# Patient Record
Sex: Female | Born: 1955 | Race: Black or African American | Hispanic: No | Marital: Single | Smoking: Never smoker
Health system: Southern US, Community
[De-identification: ages and names within clinical notes are randomized; demographics above are authoritative.]

## PROBLEM LIST (undated history)

## (undated) DIAGNOSIS — I10 Essential (primary) hypertension: Secondary | ICD-10-CM

## (undated) DIAGNOSIS — O2230 Deep phlebothrombosis in pregnancy, unspecified trimester: Secondary | ICD-10-CM

## (undated) DIAGNOSIS — I82409 Acute embolism and thrombosis of unspecified deep veins of unspecified lower extremity: Secondary | ICD-10-CM

## (undated) DIAGNOSIS — N289 Disorder of kidney and ureter, unspecified: Secondary | ICD-10-CM

## (undated) HISTORY — PX: CARPAL TUNNEL RELEASE: SHX101

---

## 2017-03-19 ENCOUNTER — Encounter (HOSPITAL_COMMUNITY): Payer: Self-pay | Admitting: Emergency Medicine

## 2017-03-19 ENCOUNTER — Emergency Department (HOSPITAL_COMMUNITY): Payer: BLUE CROSS/BLUE SHIELD

## 2017-03-19 ENCOUNTER — Emergency Department (HOSPITAL_COMMUNITY)
Admission: EM | Admit: 2017-03-19 | Discharge: 2017-03-20 | Disposition: A | Discharge status: Home / Self Care | Payer: BLUE CROSS/BLUE SHIELD | Attending: Emergency Medicine | Admitting: Emergency Medicine

## 2017-03-19 ENCOUNTER — Ambulatory Visit (HOSPITAL_COMMUNITY)
Admission: EM | Admit: 2017-03-19 | Discharge: 2017-03-19 | Disposition: A | Discharge status: Nursing Home (Includes ICF) | Payer: 59 | Attending: Urgent Care | Admitting: Urgent Care

## 2017-03-19 DIAGNOSIS — R030 Elevated blood-pressure reading, without diagnosis of hypertension: Secondary | ICD-10-CM

## 2017-03-19 DIAGNOSIS — I161 Hypertensive emergency: Secondary | ICD-10-CM

## 2017-03-19 DIAGNOSIS — R1031 Right lower quadrant pain: Secondary | ICD-10-CM | POA: Insufficient documentation

## 2017-03-19 DIAGNOSIS — I10 Essential (primary) hypertension: Secondary | ICD-10-CM | POA: Insufficient documentation

## 2017-03-19 DIAGNOSIS — R109 Unspecified abdominal pain: Secondary | ICD-10-CM

## 2017-03-19 HISTORY — DX: Essential (primary) hypertension: I10

## 2017-03-19 LAB — CBC
HEMATOCRIT: 40.8 % (ref 36.0–46.0)
HEMOGLOBIN: 13.4 g/dL (ref 12.0–15.0)
MCH: 27.7 pg (ref 26.0–34.0)
MCHC: 32.8 g/dL (ref 30.0–36.0)
MCV: 84.3 fL (ref 78.0–100.0)
Platelets: 271 10*3/uL (ref 150–400)
RBC: 4.84 MIL/uL (ref 3.87–5.11)
RDW: 14.1 % (ref 11.5–15.5)
WBC: 13 10*3/uL — ABNORMAL HIGH (ref 4.0–10.5)

## 2017-03-19 LAB — BASIC METABOLIC PANEL
ANION GAP: 8 (ref 5–15)
BUN: 10 mg/dL (ref 6–20)
CO2: 23 mmol/L (ref 22–32)
Calcium: 9.5 mg/dL (ref 8.9–10.3)
Chloride: 108 mmol/L (ref 101–111)
Creatinine, Ser: 1.44 mg/dL — ABNORMAL HIGH (ref 0.44–1.00)
GFR calc Af Amer: 44 mL/min — ABNORMAL LOW (ref >60)
GFR calc non Af Amer: 38 mL/min — ABNORMAL LOW (ref >60)
GLUCOSE: 104 mg/dL — AB (ref 65–99)
POTASSIUM: 3.7 mmol/L (ref 3.5–5.1)
Sodium: 139 mmol/L (ref 135–145)

## 2017-03-19 LAB — POCT I-STAT, CHEM 8
BUN: 11 mg/dL (ref 6–20)
CREATININE: 1.6 mg/dL — AB (ref 0.44–1.00)
Calcium, Ion: 1.19 mmol/L (ref 1.15–1.40)
Chloride: 105 mmol/L (ref 101–111)
GLUCOSE: 101 mg/dL — AB (ref 65–99)
HEMATOCRIT: 45 % (ref 36.0–46.0)
Hemoglobin: 15.3 g/dL — ABNORMAL HIGH (ref 12.0–15.0)
POTASSIUM: 4 mmol/L (ref 3.5–5.1)
Sodium: 142 mmol/L (ref 135–145)
TCO2: 26 mmol/L (ref 22–32)

## 2017-03-19 LAB — I-STAT BETA HCG BLOOD, ED (MC, WL, AP ONLY): I-stat hCG, quantitative: 9.7 m[IU]/mL — ABNORMAL HIGH (ref <5)

## 2017-03-19 MED ORDER — LABETALOL HCL 200 MG PO TABS
300.0000 mg | ORAL_TABLET | Freq: Once | ORAL | Status: AC
  Administered 2017-03-19: 300 mg via ORAL
  Filled 2017-03-19: qty 2

## 2017-03-19 MED ORDER — MORPHINE SULFATE (PF) 4 MG/ML IV SOLN
4.0000 mg | Freq: Once | INTRAVENOUS | Status: AC
  Administered 2017-03-19: 4 mg via INTRAVENOUS
  Filled 2017-03-19: qty 1

## 2017-03-19 NOTE — Discharge Instructions (Signed)
Please report to the emergency room at [facility] immediately. Your ekg shows ST wave depression and T-wave inversion and you need to have an immediate work up to make sure you heart is okay.

## 2017-03-19 NOTE — ED Notes (Signed)
Pt ambulatory to the bathroom with stand-by assist. Gait steady

## 2017-03-19 NOTE — ED Notes (Signed)
Pt returned from ultrasound

## 2017-03-19 NOTE — ED Triage Notes (Signed)
Pt c/o epigastric pain that radiates to the back.Seen at [facility] and sent to ED for cardiac workup. Pain began last night, pain has been constant.

## 2017-03-19 NOTE — ED Triage Notes (Signed)
Pt. Stated, I feel like something is jabbing me on the right side constant since last night. Im also so cold.

## 2017-03-19 NOTE — ED Triage Notes (Signed)
Pt family member knocking on triage door requesting to know when her mother will be placed in a room. Informed her how long the pt has been here and how long our longest wait is. Also informed them that we will continue to monitor her vitals and blood work. Pt states she should be placed in a room d/t her BP. Informed her we will continue to monitor.

## 2017-03-19 NOTE — ED Provider Notes (Signed)
    MRN: [id number] DOB: July 12, 1955  Subjective:   Rhonda Mcneil is a 61 y.o. female presenting for chief complaint of Abdominal Pain (right side pain)  Reports 1 day history of persistent right sided abdominal pain. Denies fever, n/v, diarrhea, chest pain, shob, heart racing, palpitations, diaphoresis. Denies history of MI. She is supposed to be on BP medications. Does not have a PCP.  Rhonda Mcneil is taking an unknown BP medication. Also is allergic to bee venom.  Rhonda Mcneil  has a past medical history of Hypertension. Also  has a past surgical history that includes Carpal tunnel release.  Objective:   Vitals: BP (!) 228/116 (BP Location: Right Arm)   Pulse 71   Temp 98.7 F (37.1 C) (Oral)   Resp (!) 1   Ht 5\' 2"  (1.575 m)   Wt 147 lb (66.7 kg)   SpO2 100%   BMI 26.89 kg/m   Physical Exam  Constitutional: She is oriented to person, place, and time. She appears well-developed and well-nourished. She appears distressed.  HENT:  Mouth/Throat: Oropharynx is clear and moist.  Eyes: No scleral icterus.  Cardiovascular: Normal rate, regular rhythm and intact distal pulses.  Exam reveals no gallop and no friction rub.   No murmur heard. Pulmonary/Chest: No respiratory distress. She has no wheezes. She has no rales.  Abdominal: Soft. Bowel sounds are normal. She exhibits no distension and no mass. There is tenderness (right-sided/flank). There is no guarding.  Musculoskeletal: She exhibits no edema.  Neurological: She is alert and oriented to person, place, and time.  Skin: Skin is warm and dry. No rash noted. No erythema. No pallor.   ED ECG REPORT   Date: 03/19/2017  Rate: 65bpm  Rhythm: sinus arrhythmia  QRS Axis: normal  Intervals: normal  ST/T Wave abnormalities: ST depressions inferiorly  Conduction Disutrbances:none  Narrative Interpretation: ST depression in Leads III, aVF; T-wave flattening in lead II, V6; T-wave inversion in leads V3-V5. These findings suggest acute process.  Old  EKG Reviewed: none available  I have personally reviewed the EKG tracing and agree with the computerized printout as noted.   Results for orders placed or performed during the hospital encounter of 03/19/17 (from the past 24 hour(s))  I-STAT, chem 8     Status: Abnormal   Collection Time: 03/19/17  6:52 PM  Result Value Ref Range   Sodium 142 135 - 145 mmol/L   Potassium 4.0 3.5 - 5.1 mmol/L   Chloride 105 101 - 111 mmol/L   BUN 11 6 - 20 mg/dL   Creatinine, Ser 1.60 (H) 0.44 - 1.00 mg/dL   Glucose, Bld 101 (H) 65 - 99 mg/dL   Calcium, Ion 1.19 1.15 - 1.40 mmol/L   TCO2 26 22 - 32 mmol/L   Hemoglobin 15.3 (H) 12.0 - 15.0 g/dL   HCT 45.0 36.0 - 46.0 %   Assessment and Plan :   Right sided abdominal pain  Hypertensive emergency  Elevated blood pressure reading  Essential hypertension  Referred to ER immediately for work up of ACS. Discussed case with Dr. Joseph Art who is in agreement.  Jaynee Eagles, PA-C [facility]  03/19/2017  6:20 PM    Jaynee Eagles, PA-C 03/19/17 2020

## 2017-03-19 NOTE — ED Notes (Signed)
Patient transported to Ultrasound 

## 2017-03-19 NOTE — ED Provider Notes (Signed)
[facility] DEPT Provider Note   CSN: [id number] Arrival date & time: 03/19/17  1911     History   Chief Complaint Chief Complaint  Patient presents with  . Abdominal Pain  . Hypertension    HPI Rhonda Mcneil is a 61 y.o. female w PMHx HTN, Presenting to the ED for acute onset of right-sided back/flank pain that began suddenly yesterday evening. Patient states pain has been worsening since that time, and is sharp "stabbing" located at the base of the right ribs posteriorly. Pain is worse with movement. She denies fever/chills, abdominal pain, nausea/vomiting, urinary symptoms, hematuria, vaginal bleeding or discharge. Patient sent from urgent care for hypertension, patient reports she did not take her blood pressure medications today; which includes labetalol 300mg  BID, norvasc 10mg  QD, and lisinopril 40mg  QD. She denies headache or vision changes. No history of abdominal surgeries. No history of gallstones or kidney stones. 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  She reports she just moved to [place] from [place] and has not established primary care. Requesting refills of BP medication as she does not believe she has refills, though she is not out of her medication yet. The history is provided by the patient.    Past Medical History:  Diagnosis Date  . Hypertension     There are no active problems to display for this patient.   Past Surgical History:  Procedure Laterality Date  . CARPAL TUNNEL RELEASE      OB History    No data available       Home Medications    Prior to Admission medications   Medication Sig Start Date End Date Taking? Authorizing Provider  amLODipine (NORVASC) 10 MG tablet Take 1 tablet (10 mg total) by mouth daily. 03/20/17   Russo, [place] N, PA-C  cyclobenzaprine (FLEXERIL) 10 MG tablet Take 1 tablet (10 mg total) by mouth at bedtime as needed for muscle spasms. 03/20/17   Russo, [place] N, PA-C  labetalol (NORMODYNE) 300 MG tablet Take 1  tablet (300 mg total) by mouth 2 (two) times daily. 03/20/17   Russo, [place] N, PA-C  lisinopril (PRINIVIL,ZESTRIL) 40 MG tablet Take 1 tablet (40 mg total) by mouth daily. 03/20/17   Russo, [place] N, PA-C    Family History No family history on file.  Social History Social History  Substance Use Topics  . Smoking status: Never Smoker  . Smokeless tobacco: Never Used  . Alcohol use Yes     Allergies   Bee venom   Review of Systems Review of Systems  Constitutional: Positive for appetite change. Negative for chills and fever.  Respiratory: Negative for shortness of breath.   Cardiovascular: Negative for chest pain.  Gastrointestinal: Negative for abdominal pain, diarrhea, nausea and vomiting.       Gas  Genitourinary: Positive for flank pain. Negative for dysuria, frequency, hematuria, vaginal bleeding and vaginal discharge.  Musculoskeletal: Positive for back pain.  All other systems reviewed and are negative.    Physical Exam Updated Vital Signs BP (!) 217/83   Pulse 68   Temp 99.6 F (37.6 C) (Oral)   Resp 16   Ht 5\' 2"  (1.575 m)   Wt 66.7 kg (147 lb)   SpO2 96%   BMI 26.89 kg/m   Physical Exam  Constitutional: She appears well-developed and well-nourished.  Appears uncomfortable, however not ill-appearing.  HENT:  Head: Normocephalic and atraumatic.  Mouth/Throat: Oropharynx is clear and moist.  Eyes: Conjunctivae are normal.  Cardiovascular: Normal rate, regular  rhythm, normal heart sounds and intact distal pulses.   Pulmonary/Chest: Effort normal and breath sounds normal.  Abdominal: Soft. Normal appearance and bowel sounds are normal. She exhibits no distension. There is no tenderness. There is CVA tenderness (Minimal right-sided). There is no rigidity, no rebound, no guarding and negative Murphy's sign.  Neurological: She is alert.  Skin: Skin is warm.  Psychiatric: She has a normal mood and affect. Her behavior is normal.  Nursing note and vitals  reviewed.    ED Treatments / Results  Labs (all labs ordered are listed, but only abnormal results are displayed) Labs Reviewed  BASIC METABOLIC PANEL - Abnormal; Notable for the following:       Result Value   Glucose, Bld 104 (*)    Creatinine, Ser 1.44 (*)    GFR calc non Af Amer 38 (*)    GFR calc Af Amer 44 (*)    All other components within normal limits  CBC - Abnormal; Notable for the following:    WBC 13.0 (*)    All other components within normal limits  URINALYSIS, ROUTINE W REFLEX MICROSCOPIC - Abnormal; Notable for the following:    Color, Urine STRAW (*)    All other components within normal limits  I-STAT BETA HCG BLOOD, ED ([facility], [facility], [facility] ONLY) - Abnormal; Notable for the following:    I-stat hCG, quantitative 9.7 (*)    All other components within normal limits  I-STAT TROPONIN, ED    EKG  EKG Interpretation None       Radiology Dg Chest 2 View  Result Date: 03/19/2017 CLINICAL DATA:  Chest pain.  High blood pressure for 1 day. EXAM: CHEST  2 VIEW COMPARISON:  None. FINDINGS: The cardiomediastinal contours are normal. The lungs are clear. Pulmonary vasculature is normal. No consolidation, pleural effusion, or pneumothorax. No acute osseous abnormalities are seen. IMPRESSION: No acute pulmonary process. Electronically Signed   By: Jeb Levering M.D.   On: 03/19/2017 21:01   US Abdomen Complete  Result Date: 03/19/2017 CLINICAL DATA:  Right flank and back pain. EXAM: ABDOMEN ULTRASOUND COMPLETE COMPARISON:  None. FINDINGS: Gallbladder: Partially distended. No gallstones or wall thickening visualized. No sonographic Murphy sign noted by sonographer. Common bile duct: Diameter: 5 mm, normal. Liver: No focal lesion identified. Within normal limits in parenchymal echogenicity. Portal vein is patent on color Doppler imaging with normal direction of blood flow towards the liver. Dirty shadowing adjacent to the liver and gallbladder is presumed bowel gas, air filled  colon is in the right upper quadrant on chest radiograph. IVC: No abnormality visualized. Pancreas: Not visualized. Spleen: Not visualized. Right Kidney: Length: 4.3 cm. Significant atrophy and thinning of the renal parenchyma. No hydronephrosis. Left Kidney: Length: 9.6 cm. Echogenicity within normal limits. No mass or hydronephrosis visualized. Rib shadowing obscures the mid kidney. Abdominal aorta: No aneurysm visualized. Other findings: None.  No ascites. IMPRESSION: 1. Significant right renal atrophy.  No hydronephrosis. 2. No gallstones or gallbladder inflammation. 3. Bowel gas limits evaluation. Pancreas and spleen are not visualized. Portions of the left kidney are obscured. Electronically Signed   By: Jeb Levering M.D.   On: 03/19/2017 22:56    Procedures Procedures (including critical care time)  Medications Ordered in ED Medications  morphine 4 MG/ML injection 4 mg (4 mg Intravenous Given 03/19/17 2159)  labetalol (NORMODYNE) tablet 300 mg (300 mg Oral Given 03/19/17 2341)     Initial Impression / Assessment and Plan / ED Course  I have  reviewed the triage vital signs and the nursing notes.  Pertinent labs & imaging results that were available during my care of the patient were reviewed by me and considered in my medical decision making (see chart for details).  Clinical Course as of Mar 21 23  Tue Mar 19, 2017  2327 Pt w improvement in sx after pain medication  [JR]  2335 Trop 0.01 per lab . Value not crossing over to EMR.  [JR]    Clinical Course User Index [JR] Russo, [place] N, PA-C    Pt presenting w acute onset of right-sided flank pain that began last night, worse w movement. Pt sent here from urgent care with HTN, however pt reports she did not take her BP medications today. Pt without HA, vision changes, CP or SOB. Right flank pain reproducible on exam. Abdominal exam benign, neg Murphy's. Abd US done and neg for cholelithiasis, nephrolithiasis, aortic dilation, or  other acute pathology. CXR neg. Cr slightly elevated. WBC slightly elev 13. U/A neg. Trop 0.01. EKG without ischemic changes. Home dose of labetalol given in ED with improvement in BP to 865H systolic. Suspect pain sx 2/t muscle spasm. Will discharge w strict return precautions, flexeril, and BP medication refills. Pt not in distress, safe for discharge.  Patient discussed with Dr. Venora Maples.  Discussed results, findings, treatment and follow up. Patient advised of return precautions. Patient verbalized understanding and agreed with plan.   Final Clinical Impressions(s) / ED Diagnoses   Final diagnoses:  Right flank pain    New Prescriptions New Prescriptions   AMLODIPINE (NORVASC) 10 MG TABLET    Take 1 tablet (10 mg total) by mouth daily.   CYCLOBENZAPRINE (FLEXERIL) 10 MG TABLET    Take 1 tablet (10 mg total) by mouth at bedtime as needed for muscle spasms.   LABETALOL (NORMODYNE) 300 MG TABLET    Take 1 tablet (300 mg total) by mouth 2 (two) times daily.   LISINOPRIL (PRINIVIL,ZESTRIL) 40 MG TABLET    Take 1 tablet (40 mg total) by mouth daily.     Russo, [place] N, PA-C 03/20/17 Larena Glassman    Jola Schmidt, MD 03/20/17 [id number]

## 2017-03-20 LAB — URINALYSIS, ROUTINE W REFLEX MICROSCOPIC
Bilirubin Urine: NEGATIVE
Glucose, UA: NEGATIVE mg/dL
Hgb urine dipstick: NEGATIVE
Ketones, ur: NEGATIVE mg/dL
Leukocytes, UA: NEGATIVE
NITRITE: NEGATIVE
PH: 7 (ref 5.0–8.0)
Protein, ur: NEGATIVE mg/dL
SPECIFIC GRAVITY, URINE: 1.006 (ref 1.005–1.030)

## 2017-03-20 MED ORDER — CYCLOBENZAPRINE HCL 10 MG PO TABS: 10.0000 mg | ORAL_TABLET | Freq: Every evening | ORAL | 0 refills | Status: DC | PRN

## 2017-03-20 MED ORDER — LABETALOL HCL 300 MG PO TABS: 300.0000 mg | ORAL_TABLET | Freq: Two times a day (BID) | ORAL | 0 refills | Status: DC

## 2017-03-20 MED ORDER — ONDANSETRON 4 MG PO TBDP: 4.0000 mg | ORAL_TABLET | Freq: Three times a day (TID) | ORAL | 0 refills | Status: DC | PRN

## 2017-03-20 MED ORDER — AMLODIPINE BESYLATE 10 MG PO TABS: 10.0000 mg | ORAL_TABLET | Freq: Every day | ORAL | 0 refills | Status: AC

## 2017-03-20 MED ORDER — LISINOPRIL 40 MG PO TABS: 40.0000 mg | ORAL_TABLET | Freq: Every day | ORAL | 0 refills | Status: DC

## 2017-03-20 NOTE — Discharge Instructions (Signed)
Please read instructions below. Take your blood pressure medication as prescribed. You can take Flexeril at bedtime as needed for muscle spasm. Drink plenty of water. Establish primary care using the phone number on the back of your insurance card, or the phone number in the back of this paperwork. Return to the ER for new or concerning symptoms.

## 2017-03-20 NOTE — ED Notes (Signed)
PT states understanding of care given, follow up care, and medication prescribed. PT ambulated from ED to car with a steady gait. 

## 2017-05-19 ENCOUNTER — Encounter (HOSPITAL_COMMUNITY): Payer: Self-pay | Admitting: Emergency Medicine

## 2017-05-19 ENCOUNTER — Inpatient Hospital Stay (HOSPITAL_COMMUNITY)
Admission: EM | Admit: 2017-05-19 | Discharge: 2017-05-22 | DRG: 871 | Disposition: A | Discharge status: Home / Self Care | Payer: BLUE CROSS/BLUE SHIELD | Attending: Internal Medicine | Admitting: Internal Medicine

## 2017-05-19 ENCOUNTER — Ambulatory Visit (HOSPITAL_COMMUNITY)
Admission: EM | Admit: 2017-05-19 | Discharge: 2017-05-19 | Disposition: A | Discharge status: Home / Self Care | Payer: BLUE CROSS/BLUE SHIELD | Attending: Family Medicine | Admitting: Family Medicine

## 2017-05-19 ENCOUNTER — Emergency Department (HOSPITAL_COMMUNITY): Payer: BLUE CROSS/BLUE SHIELD

## 2017-05-19 ENCOUNTER — Other Ambulatory Visit: Payer: Self-pay

## 2017-05-19 DIAGNOSIS — J181 Lobar pneumonia, unspecified organism: Secondary | ICD-10-CM | POA: Diagnosis present

## 2017-05-19 DIAGNOSIS — E876 Hypokalemia: Secondary | ICD-10-CM | POA: Diagnosis not present

## 2017-05-19 DIAGNOSIS — Z79899 Other long term (current) drug therapy: Secondary | ICD-10-CM | POA: Diagnosis not present

## 2017-05-19 DIAGNOSIS — T364X5A Adverse effect of tetracyclines, initial encounter: Secondary | ICD-10-CM | POA: Diagnosis not present

## 2017-05-19 DIAGNOSIS — A419 Sepsis, unspecified organism: Secondary | ICD-10-CM | POA: Diagnosis not present

## 2017-05-19 DIAGNOSIS — I129 Hypertensive chronic kidney disease with stage 1 through stage 4 chronic kidney disease, or unspecified chronic kidney disease: Secondary | ICD-10-CM | POA: Diagnosis not present

## 2017-05-19 DIAGNOSIS — R079 Chest pain, unspecified: Secondary | ICD-10-CM

## 2017-05-19 DIAGNOSIS — R112 Nausea with vomiting, unspecified: Secondary | ICD-10-CM | POA: Diagnosis not present

## 2017-05-19 DIAGNOSIS — R509 Fever, unspecified: Secondary | ICD-10-CM

## 2017-05-19 DIAGNOSIS — N183 Chronic kidney disease, stage 3 (moderate): Secondary | ICD-10-CM | POA: Diagnosis present

## 2017-05-19 DIAGNOSIS — Z87891 Personal history of nicotine dependence: Secondary | ICD-10-CM | POA: Diagnosis not present

## 2017-05-19 DIAGNOSIS — J189 Pneumonia, unspecified organism: Secondary | ICD-10-CM | POA: Diagnosis present

## 2017-05-19 DIAGNOSIS — J159 Unspecified bacterial pneumonia: Secondary | ICD-10-CM | POA: Diagnosis not present

## 2017-05-19 LAB — COMPREHENSIVE METABOLIC PANEL
ALT: 20 U/L (ref 14–54)
AST: 39 U/L (ref 15–41)
Albumin: 3.4 g/dL — ABNORMAL LOW (ref 3.5–5.0)
Alkaline Phosphatase: 125 U/L (ref 38–126)
Anion gap: 10 (ref 5–15)
BUN: 10 mg/dL (ref 6–20)
CHLORIDE: 96 mmol/L — AB (ref 101–111)
CO2: 24 mmol/L (ref 22–32)
Calcium: 9.3 mg/dL (ref 8.9–10.3)
Creatinine, Ser: 1.65 mg/dL — ABNORMAL HIGH (ref 0.44–1.00)
GFR, EST AFRICAN AMERICAN: 37 mL/min — AB (ref >60)
GFR, EST NON AFRICAN AMERICAN: 32 mL/min — AB (ref >60)
Glucose, Bld: 154 mg/dL — ABNORMAL HIGH (ref 65–99)
POTASSIUM: 3.6 mmol/L (ref 3.5–5.1)
SODIUM: 130 mmol/L — AB (ref 135–145)
Total Bilirubin: 1.8 mg/dL — ABNORMAL HIGH (ref 0.3–1.2)
Total Protein: 7.6 g/dL (ref 6.5–8.1)

## 2017-05-19 LAB — CBC
HCT: 40.1 % (ref 36.0–46.0)
HEMOGLOBIN: 13.2 g/dL (ref 12.0–15.0)
MCH: 27.6 pg (ref 26.0–34.0)
MCHC: 32.9 g/dL (ref 30.0–36.0)
MCV: 83.9 fL (ref 78.0–100.0)
PLATELETS: 247 10*3/uL (ref 150–400)
RBC: 4.78 MIL/uL (ref 3.87–5.11)
RDW: 14.4 % (ref 11.5–15.5)
WBC: 18.4 10*3/uL — AB (ref 4.0–10.5)

## 2017-05-19 LAB — I-STAT CG4 LACTIC ACID, ED
LACTIC ACID, VENOUS: 1.17 mmol/L (ref 0.5–1.9)
LACTIC ACID, VENOUS: 0.92 mmol/L (ref 0.5–1.9)

## 2017-05-19 LAB — I-STAT TROPONIN, ED: TROPONIN I, POC: 0.02 ng/mL (ref 0.00–0.08)

## 2017-05-19 MED ORDER — OXYCODONE HCL 5 MG PO TABS
2.5000 mg | ORAL_TABLET | ORAL | Status: DC | PRN
  Administered 2017-05-19: 2.5 mg via ORAL
  Filled 2017-05-19 (×2): qty 1

## 2017-05-19 MED ORDER — ACETAMINOPHEN 325 MG PO TABS
650.0000 mg | ORAL_TABLET | Freq: Once | ORAL | Status: AC
  Administered 2017-05-19: 650 mg via ORAL

## 2017-05-19 MED ORDER — SODIUM CHLORIDE 0.9% FLUSH
3.0000 mL | Freq: Two times a day (BID) | INTRAVENOUS | Status: DC
  Administered 2017-05-20 – 2017-05-21 (×4): 3 mL via INTRAVENOUS

## 2017-05-19 MED ORDER — HEPARIN SODIUM (PORCINE) 5000 UNIT/ML IJ SOLN
5000.0000 [IU] | Freq: Three times a day (TID) | INTRAMUSCULAR | Status: DC
  Administered 2017-05-20 – 2017-05-22 (×8): 5000 [IU] via SUBCUTANEOUS
  Filled 2017-05-19 (×8): qty 1

## 2017-05-19 MED ORDER — DOXYCYCLINE HYCLATE 100 MG PO TABS
100.0000 mg | ORAL_TABLET | Freq: Once | ORAL | Status: AC
  Administered 2017-05-19: 100 mg via ORAL
  Filled 2017-05-19: qty 1

## 2017-05-19 MED ORDER — DEXTROSE 5 % IV SOLN
1.0000 g | Freq: Once | INTRAVENOUS | Status: AC
  Administered 2017-05-19: 1 g via INTRAVENOUS
  Filled 2017-05-19: qty 10

## 2017-05-19 MED ORDER — DEXTROSE 5 % IV SOLN
1.0000 g | INTRAVENOUS | Status: DC
  Administered 2017-05-20 – 2017-05-21 (×2): 1 g via INTRAVENOUS
  Filled 2017-05-19 (×2): qty 10

## 2017-05-19 MED ORDER — LACTATED RINGERS IV SOLN
INTRAVENOUS | Status: AC
Start: 2017-05-19 — End: 2017-05-20
  Administered 2017-05-20: 01:00:00 via INTRAVENOUS

## 2017-05-19 MED ORDER — FENTANYL CITRATE (PF) 100 MCG/2ML IJ SOLN
50.0000 ug | INTRAMUSCULAR | Status: DC | PRN
  Filled 2017-05-19: qty 2

## 2017-05-19 MED ORDER — ACETAMINOPHEN 500 MG PO TABS
1000.0000 mg | ORAL_TABLET | Freq: Three times a day (TID) | ORAL | Status: AC
  Administered 2017-05-20: 1000 mg via ORAL
  Filled 2017-05-19: qty 2

## 2017-05-19 MED ORDER — ACETAMINOPHEN 325 MG PO TABS
ORAL_TABLET | ORAL | Status: AC
  Filled 2017-05-19: qty 2

## 2017-05-19 MED ORDER — SODIUM CHLORIDE 0.9 % IV BOLUS (SEPSIS)
500.0000 mL | Freq: Once | INTRAVENOUS | Status: AC
  Administered 2017-05-19: 500 mL via INTRAVENOUS

## 2017-05-19 MED ORDER — DOXYCYCLINE HYCLATE 100 MG PO TABS
100.0000 mg | ORAL_TABLET | Freq: Two times a day (BID) | ORAL | Status: DC
  Administered 2017-05-20 – 2017-05-21 (×4): 100 mg via ORAL
  Filled 2017-05-19 (×4): qty 1

## 2017-05-19 NOTE — ED Provider Notes (Signed)
[facility]    CSN: [id number] Arrival date & time: 05/19/17  1731     History   Chief Complaint Chief Complaint  Patient presents with  . Fever    HPI Rhonda Mcneil is a 61 y.o. female.   [name] presents with her family with complaints of right sided chest pain which seemed to start today. It is painful with breathing and with certain movements. It radiates to her right shoudler. Denies cough, shortness of breath, runny nose, ear pain, sore throat. She has not felt feverish, despite elevated temp here in clinic. Rates pain 9/10. Denies leg pain or swelling, recent travel, she does not smoke. Denies nausea or vomiting. States she has had a DVT in the past. No longer is on any blood thinners. No known ill contacts.    ROS per HPI.       Past Medical History:  Diagnosis Date  . Hypertension     There are no active problems to display for this patient.   Past Surgical History:  Procedure Laterality Date  . CARPAL TUNNEL RELEASE      OB History    No data available       Home Medications    Prior to Admission medications   Medication Sig Start Date End Date Taking? Authorizing Provider  amLODipine (NORVASC) 10 MG tablet Take 1 tablet (10 mg total) by mouth daily. 03/20/17   Robinson, [place] N, PA-C  cyclobenzaprine (FLEXERIL) 10 MG tablet Take 1 tablet (10 mg total) by mouth at bedtime as needed for muscle spasms. 03/20/17   Robinson, [place] N, PA-C  labetalol (NORMODYNE) 300 MG tablet Take 1 tablet (300 mg total) by mouth 2 (two) times daily. 03/20/17   Robinson, [place] N, PA-C  lisinopril (PRINIVIL,ZESTRIL) 40 MG tablet Take 1 tablet (40 mg total) by mouth daily. 03/20/17   Robinson, [place] N, PA-C  ondansetron (ZOFRAN ODT) 4 MG disintegrating tablet Take 1 tablet (4 mg total) by mouth every 8 (eight) hours as needed for nausea or vomiting. 03/20/17   Robinson, [place] N, PA-C    Family History History reviewed. No pertinent family history.  Social  History Social History   Tobacco Use  . Smoking status: Never Smoker  . Smokeless tobacco: Never Used  Substance Use Topics  . Alcohol use: Yes  . Drug use: No     Allergies   Bee venom   Review of Systems Review of Systems   Physical Exam Triage Vital Signs ED Triage Vitals  Enc Vitals Group     BP 05/19/17 1752 140/75     Pulse Rate 05/19/17 1752 74     Resp 05/19/17 1752 18     Temp 05/19/17 1752 (!) 102.7 F (39.3 C)     Temp Source 05/19/17 1752 Oral     SpO2 05/19/17 1752 95 %     Weight --      Height --      Head Circumference --      Peak Flow --      Pain Score 05/19/17 1805 10     Pain Loc --      Pain Edu? --      Excl. in [place]? --    No data found.  Updated Vital Signs BP 140/75 (BP Location: Right Arm)   Pulse 74   Temp (!) 102.7 F (39.3 C) (Oral)   Resp 18   SpO2 95%   Visual Acuity Right Eye Distance:   Left Eye  Distance:   Bilateral Distance:    Right Eye Near:   Left Eye Near:    Bilateral Near:     Physical Exam  Constitutional: She is oriented to person, place, and time. She appears well-developed and well-nourished. No distress.  Cardiovascular: Normal rate, regular rhythm and normal heart sounds.  Pulmonary/Chest: Effort normal and breath sounds normal.  Neurological: She is alert and oriented to person, place, and time.  Skin: Skin is warm and dry.  Vitals reviewed.    UC Treatments / Results  Labs (all labs ordered are listed, but only abnormal results are displayed) Labs Reviewed - No data to display  EKG  EKG Interpretation None       Radiology No results found.  Procedures Procedures (including critical care time)  Medications Ordered in UC Medications  acetaminophen (TYLENOL) tablet 650 mg (650 mg Oral Given 05/19/17 1802)     Initial Impression / Assessment and Plan / UC Course  I have reviewed the triage vital signs and the nursing notes.  Pertinent labs & imaging results that were available  during my care of the patient were reviewed by me and considered in my medical decision making (see chart for details).     Fever, chest pain without URI symptoms, history of DVT. Recommended patient go to ED for evaluation to rule out PE at this time. Discussed with patient differentials of muscular or costochondritic pain, or pneumonia. Patient grasps at her chest during discussion, and expresses pain during respiratory exam and deep breathing. Patient with family members who are able to transport her to ED. Patient verbalized understanding and agreeable to plan.    Final Clinical Impressions(s) / UC Diagnoses   Final diagnoses:  Fever, unspecified  Right-sided chest pain    ED Discharge Orders    None       Controlled Substance Prescriptions [place] Controlled Substance Registry consulted? Not Applicable   Zigmund Gottron, NP 05/19/17 1810

## 2017-05-19 NOTE — H&P (Signed)
History and Physical   Rhonda Mcneil AYT:[id number] DOB: 1956-06-06 DOA: 05/19/2017  PCP: Patient, No Pcp Per  Chief Complaint: Chest pain  HPI: 61 year old woman with hypertension and chronic kidney disease stage IIIB (bCR ~1.5) presenting with right-sided chest pain in the setting of productive cough, fever, chills. Onset was 12-24 hrs. ago, describes the pain as sharp and located in the right side of her chest, radiates to her right armpit, worse with taking a deep breath, weight is 9 out of 10. Denies any nausea vomiting or diarrhea. Reports being diagnosed pneumonia years ago but has not been recently ill. No hemoptysis or shortness of breath.  She reports she takes medication for blood pressure and that she knows she has some degree of chronic kidney disease. She denies any syncope, dizziness, abdominal pain.  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  ED Course: In emergency department heart rate of 56, blood pressure 107/52, increase respiratory rate to 23, white count of 18,000, creatinine of 1.65, T bili of 1.8, albumin of 3.4, lactic acid within normal limits. Patient was given ceftriaxone and doxycycline for treatment of pneumonia. EKG performed showed T-wave inversion in the lateral leads as well as inferior leads. Troponin was within normal limits 1. CXR to my eye appears to have infiltrate RLL.  Review of Systems: A complete ROS was obtained; pertinent positives negatives are denoted in the HPI. Otherwise, all systems are negative.   Past Medical History:  Diagnosis Date  . Hypertension    Social History   Socioeconomic History  . Marital status: Single    Spouse name: Not on file  . Number of children: Not on file  . Years of education: Not  on file  . Highest education level: Not on file  Social Needs  . Financial resource strain: Not on file  . Food insecurity - worry: Not on file  . Food insecurity - inability: Not on file  . Transportation needs - medical: Not on file  . Transportation needs - non-medical: Not on file  Occupational History  . Not on file  Tobacco Use  . Smoking status: Never Smoker  . Smokeless tobacco: Never Used  Substance and Sexual Activity  . Alcohol use: Yes  . Drug use: No  . Sexual activity: Not on file  Other Topics Concern  . Not on file  Social History Narrative  . Not on file   Family hx: Mother died at age 22 of cervical cancer  Physical Exam: Vitals:   05/19/17 1817 05/19/17 2045 05/19/17 2100  BP: (!) 150/90 117/73 (!) 107/57  Pulse: 76 67 (!) 56  Resp: 16 20 (!) 23  Temp: 99.1 F (37.3 C)    TempSrc: Oral    SpO2: 94% 96% 97%  Weight: 65.8 kg (145 lb)    Height: 5\' 2"  (1.575 m)     General: Appears calm and comfortable, black woman, no acute distress ENT: Grossly normal hearing, mildly dry mucus membranes, periodontal disease likely present Cardiovascular: RRR. No M/R/G. No LE edema.  Respiratory: Breathing room air, CTA bilaterally with exception of reduced breath sounds at right lung base. No wheezes or crackles. Normal respiratory effort. Abdomen: Soft, non-tender. Bowel sounds present.  Skin: No rash or induration seen on limited exam. Musculoskeletal: Grossly  normal tone BUE/BLE. Appropriate ROM.  Psychiatric: Grossly normal mood and affect. Neurologic: Moves all extremities in coordinated fashion.  I have personally reviewed the following labs, culture data, and imaging studies.  Assessment/Plan:  Sepsis due to pneumonia, bacterial likely Course: fever to 102.22F, increased RR, with leukocytosis, source suspected lung Assessment: currently SBP just above 100, patient on BB at home, no MRSA or pseudomonal risk factors evident Plan: will treat for community  acquired bacterial pneumonia (ED physician ordered ceftriaxone + doxycycline), I will continue that treatment as it is appropriate for typical and atypical organisms, checking blood cultures, sputum culture, and RVP to investigate for viral or atypical etiology to current infection; anticipate that if she continues to maintain clinical stability can likely transition to fluoroquinolone at discharge to complete a course of therapy; hydrating with LR at 100 cc x 10 hours  Other problems: -R sided pleuritic CP: in the setting of pneumonia, likely that pneumonia is underlying cause, EKG with some TWI, will obtain troponin to ensure continued normalcy, acetaminophen 1 g 8 hrs x 2 doses with low dose oxycodone as needed for break through pain (avoidance of NSAIDs given CKD status); anticipate pleuritic CP will improve with treatment of pneumonia -CKD3B: monitor -HTN: holding home BB and CCB given sepsis on borderline BP on admission -Mildly elevated T bili: suspect related to sepsis, repeat LFTs in AM  DVT prophylaxis: [place] heparin given CKD status Code Status: full code Disposition Plan: Anticipate D/C home in 1-23 Consults called: none Admission status: admit to hospital medicine team   Cheri Rous, MD [facility] Page:[phone]  If 7PM-7AM, please contact night-coverage [email or web address] Password [email or web address]

## 2017-05-19 NOTE — ED Triage Notes (Signed)
Pt c/o right sided rib pain under breast; pt noted to have fever; pt sts chills and body aches

## 2017-05-19 NOTE — Discharge Instructions (Signed)
Please go to the ER for further evaluation of your chest pain and fever, with concern for possible blood clot.

## 2017-05-19 NOTE — ED Notes (Signed)
Rhonda Mcneil reports that she vomiyed just a few minutes ago

## 2017-05-19 NOTE — ED Triage Notes (Addendum)
Pt reports pain under R breast that radiates to R shoulder with fever that started today.  Denies chills and body aches.

## 2017-05-19 NOTE — ED Provider Notes (Signed)
[facility] EMERGENCY DEPARTMENT Provider Note   CSN: [id number] Arrival date & time: 05/19/17  1809     History   Chief Complaint Chief Complaint  Patient presents with  . Chest Pain    HPI Rhonda Mcneil is a 61 y.o. female.  Patient presents with productive cough, fevers, chills and pleuritic pain. Patient was sent over from urgent care for blood clot workup. No current antibiotics. Patient's had pneumonia in the past. No significant sick contacts. No smoking history.      Past Medical History:  Diagnosis Date  . Hypertension     Patient Active Problem List   Diagnosis Date Noted  . Pneumonia 05/19/2017    Past Surgical History:  Procedure Laterality Date  . CARPAL TUNNEL RELEASE      OB History    No data available       Home Medications    Prior to Admission medications   Medication Sig Start Date End Date Taking? Authorizing Provider  amLODipine (NORVASC) 10 MG tablet Take 1 tablet (10 mg total) by mouth daily. 03/20/17  Yes Quentin Cornwall, [place] N, PA-C  atenolol-chlorthalidone (TENORETIC) 100-25 MG tablet Take 1 tablet by mouth daily. 04/26/17  Yes [provider]  gabapentin (NEURONTIN) 300 MG capsule Take 300 mg by mouth at bedtime as needed for pain. 04/03/17  Yes [provider]  labetalol (NORMODYNE) 300 MG tablet Take 1 tablet (300 mg total) by mouth 2 (two) times daily. Patient not taking: Reported on 05/19/2017 03/20/17   Robinson, [place] N, PA-C  lisinopril (PRINIVIL,ZESTRIL) 40 MG tablet Take 1 tablet (40 mg total) by mouth daily. Patient not taking: Reported on 05/19/2017 03/20/17   Robinson, [place] N, PA-C    Family History No family history on file.  Social History Social History   Tobacco Use  . Smoking status: Never Smoker  . Smokeless tobacco: Never Used  Substance Use Topics  . Alcohol use: Yes  . Drug use: No     Allergies   Bee venom   Review of Systems Review of Systems  Constitutional:  Positive for fever. Negative for chills.  HENT: Positive for congestion.   Eyes: Negative for visual disturbance.  Respiratory: Positive for cough. Negative for shortness of breath.   Cardiovascular: Positive for chest pain.  Gastrointestinal: Negative for abdominal pain and vomiting.  Genitourinary: Negative for dysuria and flank pain.  Musculoskeletal: Negative for back pain, neck pain and neck stiffness.  Skin: Negative for rash.  Neurological: Negative for light-headedness and headaches.     Physical Exam Updated Vital Signs BP 138/85   Pulse 65   Temp 99.1 F (37.3 C) (Oral)   Resp 18   Ht 5\' 2"  (1.575 m)   Wt 65.8 kg (145 lb)   SpO2 97%   BMI 26.52 kg/m   Physical Exam  Constitutional: She is oriented to person, place, and time. She appears well-developed and well-nourished.  HENT:  Head: Normocephalic and atraumatic.  Eyes: Conjunctivae are normal. Right eye exhibits no discharge. Left eye exhibits no discharge.  Neck: Normal range of motion. Neck supple. No tracheal deviation present.  Cardiovascular: Normal rate and regular rhythm.  Pulmonary/Chest: Effort normal. She has rales in the right middle field.  Abdominal: Soft. She exhibits no distension. There is no tenderness. There is no guarding.  Musculoskeletal: She exhibits no edema.  Neurological: She is alert and oriented to person, place, and time.  Skin: Skin is warm. No rash noted.  Psychiatric: She has  a normal mood and affect.  Nursing note and vitals reviewed.    ED Treatments / Results  Labs (all labs ordered are listed, but only abnormal results are displayed) Labs Reviewed  CBC - Abnormal; Notable for the following components:      Result Value   WBC 18.4 (*)    All other components within normal limits  COMPREHENSIVE METABOLIC PANEL - Abnormal; Notable for the following components:   Sodium 130 (*)    Chloride 96 (*)    Glucose, Bld 154 (*)    Creatinine, Ser 1.65 (*)    Albumin 3.4 (*)      Total Bilirubin 1.8 (*)    GFR calc non Af Amer 32 (*)    GFR calc Af Amer 37 (*)    All other components within normal limits  CULTURE, EXPECTORATED SPUTUM-ASSESSMENT  RESPIRATORY PANEL BY PCR  COMPREHENSIVE METABOLIC PANEL  CBC  TROPONIN I  I-STAT TROPONIN, ED  I-STAT CG4 LACTIC ACID, ED  I-STAT CG4 LACTIC ACID, ED    EKG  EKG Interpretation  Date/Time:  Sunday May 19 2017 18:38:19 EST Ventricular Rate:  70 PR Interval:  130 QRS Duration: 86 QT Interval:  412 QTC Calculation: 444 R Axis:   5 Text Interpretation:  Sinus rhythm with Premature atrial complexes ST & T wave abnormality, consider inferior ischemia ST & T wave abnormality, consider anterolateral ischemia Abnormal ECG Similar morphology to previous Confirmed by Elnora Morrison [phone]) on 05/19/2017 9:08:36 PM       Radiology Dg Chest 2 View  Result Date: 05/19/2017 CLINICAL DATA:  Patient with pain in the the right breast in shoulder. Initial encounter. EXAM: CHEST  2 VIEW COMPARISON:  Chest radiograph 03/19/2017. FINDINGS: Enlarged cardiac and mediastinal contours. Right mid lower lung consolidative opacities. No pleural effusion or pneumothorax. Thoracic spine degenerative changes. IMPRESSION: Right mid and lower lung consolidative opacities concerning for pneumonia in the appropriate clinical setting. Followup PA and lateral chest X-ray is recommended in 3-4 weeks following trial of antibiotic therapy to ensure resolution and exclude underlying malignancy. Electronically Signed   By: Lovey Newcomer M.D.   On: 05/19/2017 21:57    Procedures Procedures (including critical care time)  Medications Ordered in ED Medications  heparin injection 5,000 Units (not administered)  sodium chloride flush (NS) 0.9 % injection 3 mL (not administered)  cefTRIAXone (ROCEPHIN) 1 g in dextrose 5 % 50 mL IVPB (not administered)  doxycycline (VIBRA-TABS) tablet 100 mg (not administered)  acetaminophen (TYLENOL) tablet 1,000  mg (not administered)  oxyCODONE (Oxy IR/ROXICODONE) immediate release tablet 2.5 mg (2.5 mg Oral Given 05/19/17 2232)  lactated ringers infusion (not administered)  sodium chloride 0.9 % bolus 500 mL (500 mLs Intravenous New Bag/Given 05/19/17 2221)  cefTRIAXone (ROCEPHIN) 1 g in dextrose 5 % 50 mL IVPB (0 g Intravenous Stopped 05/19/17 2311)  doxycycline (VIBRA-TABS) tablet 100 mg (100 mg Oral Given 05/19/17 2221)     Initial Impression / Assessment and Plan / ED Course  I have reviewed the triage vital signs and the nursing notes.  Pertinent labs & imaging results that were available during my care of the patient were reviewed by me and considered in my medical decision making (see chart for details).    Patient presents with clinical community acquired pneumonia. Plan for pain meds antibiotics and reassessment. Patient does have history of blood clot years ago not currently on anticoagulants. Discussed most likely pneumonia not blood clot however the kidney function and dye allergy unable  to get CT scan. Plan for observation in the hospital in about exhorted.  Final Clinical Impressions(s) / ED Diagnoses   Final diagnoses:  Community acquired pneumonia of right lower lobe of lung [facility])    ED Discharge Orders    None       Elnora Morrison, MD 05/19/17 [phone]

## 2017-05-20 ENCOUNTER — Other Ambulatory Visit: Payer: Self-pay

## 2017-05-20 DIAGNOSIS — J181 Lobar pneumonia, unspecified organism: Secondary | ICD-10-CM | POA: Diagnosis not present

## 2017-05-20 DIAGNOSIS — E876 Hypokalemia: Secondary | ICD-10-CM | POA: Diagnosis not present

## 2017-05-20 DIAGNOSIS — R071 Chest pain on breathing: Secondary | ICD-10-CM

## 2017-05-20 DIAGNOSIS — I10 Essential (primary) hypertension: Secondary | ICD-10-CM

## 2017-05-20 LAB — CBC
HEMATOCRIT: 40.3 % (ref 36.0–46.0)
HEMOGLOBIN: 13.1 g/dL (ref 12.0–15.0)
MCH: 27.5 pg (ref 26.0–34.0)
MCHC: 32.5 g/dL (ref 30.0–36.0)
MCV: 84.5 fL (ref 78.0–100.0)
Platelets: 258 10*3/uL (ref 150–400)
RBC: 4.77 MIL/uL (ref 3.87–5.11)
RDW: 14.6 % (ref 11.5–15.5)
WBC: 18.4 10*3/uL — ABNORMAL HIGH (ref 4.0–10.5)

## 2017-05-20 LAB — COMPREHENSIVE METABOLIC PANEL
ALT: 20 U/L (ref 14–54)
ANION GAP: 10 (ref 5–15)
AST: 27 U/L (ref 15–41)
Albumin: 3.2 g/dL — ABNORMAL LOW (ref 3.5–5.0)
Alkaline Phosphatase: 120 U/L (ref 38–126)
BUN: 11 mg/dL (ref 6–20)
CHLORIDE: 103 mmol/L (ref 101–111)
CO2: 25 mmol/L (ref 22–32)
CREATININE: 1.63 mg/dL — AB (ref 0.44–1.00)
Calcium: 9.6 mg/dL (ref 8.9–10.3)
GFR, EST AFRICAN AMERICAN: 38 mL/min — AB (ref >60)
GFR, EST NON AFRICAN AMERICAN: 33 mL/min — AB (ref >60)
Glucose, Bld: 137 mg/dL — ABNORMAL HIGH (ref 65–99)
POTASSIUM: 3.9 mmol/L (ref 3.5–5.1)
SODIUM: 138 mmol/L (ref 135–145)
Total Bilirubin: 1.3 mg/dL — ABNORMAL HIGH (ref 0.3–1.2)
Total Protein: 7.3 g/dL (ref 6.5–8.1)

## 2017-05-20 LAB — RESPIRATORY PANEL BY PCR
Adenovirus: NOT DETECTED
BORDETELLA PERTUSSIS-RVPCR: NOT DETECTED
CHLAMYDOPHILA PNEUMONIAE-RVPPCR: NOT DETECTED
CORONAVIRUS OC43-RVPPCR: NOT DETECTED
Coronavirus 229E: NOT DETECTED
Coronavirus HKU1: NOT DETECTED
Coronavirus NL63: NOT DETECTED
INFLUENZA B-RVPPCR: NOT DETECTED
Influenza A: NOT DETECTED
MYCOPLASMA PNEUMONIAE-RVPPCR: NOT DETECTED
Metapneumovirus: NOT DETECTED
PARAINFLUENZA VIRUS 1-RVPPCR: NOT DETECTED
Parainfluenza Virus 2: NOT DETECTED
Parainfluenza Virus 3: NOT DETECTED
Parainfluenza Virus 4: NOT DETECTED
RESPIRATORY SYNCYTIAL VIRUS-RVPPCR: NOT DETECTED
Rhinovirus / Enterovirus: NOT DETECTED

## 2017-05-20 LAB — TROPONIN I

## 2017-05-20 MED ORDER — AMLODIPINE BESYLATE 5 MG PO TABS
5.0000 mg | ORAL_TABLET | Freq: Every day | ORAL | Status: DC
  Administered 2017-05-21 – 2017-05-22 (×2): 5 mg via ORAL
  Filled 2017-05-20 (×2): qty 1

## 2017-05-20 MED ORDER — GABAPENTIN 300 MG PO CAPS: 300.0000 mg | ORAL_CAPSULE | Freq: Every evening | ORAL | Status: DC | PRN

## 2017-05-20 MED ORDER — OXYCODONE HCL 5 MG PO TABS
5.0000 mg | ORAL_TABLET | ORAL | Status: DC | PRN
  Administered 2017-05-20: 5 mg via ORAL
  Filled 2017-05-20: qty 1

## 2017-05-20 MED ORDER — SODIUM CHLORIDE 0.9 % IV SOLN
INTRAVENOUS | Status: AC
  Administered 2017-05-20: 18:00:00 via INTRAVENOUS

## 2017-05-20 MED ORDER — AMLODIPINE BESYLATE 10 MG PO TABS
10.0000 mg | ORAL_TABLET | Freq: Every day | ORAL | Status: DC
  Administered 2017-05-20: 10 mg via ORAL
  Filled 2017-05-20: qty 1

## 2017-05-20 NOTE — Progress Notes (Signed)
PROGRESS NOTE    Rhonda Mcneil  IWL:[id number] DOB: [birth year] DOA: 05/19/2017 PCP: Patient, No Pcp Per   Outpatient Specialists:    Brief Narrative:  61 year old woman with hypertension and chronic kidney disease stage III (bCR ~1.5) presenting with right-sided chest pain in the setting of productive cough, fever, chills. Onset was 12-24 hrs. ago, describes the pain as sharp and located in the right side of her chest, radiates to her right armpit, worse with taking a deep breath, weight is 9 out of 10. Denies any nausea vomiting or diarrhea. Reports being diagnosed pneumonia years ago but has not been recently ill. No hemoptysis or shortness of breath.    Assessment & Plan:   Active Problems:   Pneumonia   Sepsis due to pneumonia, bacterial likely Course: fever to 102.27F, increased RR, with leukocytosis, source suspected lung X ray: Right mid and lower lung consolidative opacities concerning for pneumonia in the appropriate clinical setting. Followup PA and lateral chest X-ray is recommended in 3-4 weeks following trial of antibiotic therapy  (ED physician ordered ceftriaxone + doxycycline) -NP swab/flub swab pending  R sided pleuritic CP: in the setting of pneumonia, likely that pneumonia is underlying cause,  CKD3: monitor  HTN: resume home meds  Mildly elevated T bili: suspect related to sepsis -improved this AM     DVT prophylaxis:  SQ Heparin  Code Status: Full Code   Family Communication:   Disposition Plan:     Consultants:      Subjective: Pain under right breast  Objective: Vitals:   05/19/17 2215 05/20/17 0148 05/20/17 0621 05/20/17 1058  BP: 138/85 112/65 (!) 152/128 (!) 163/85  Pulse: 65 66 71 74  Resp: 18 16 18 16   Temp:  98.7 F (37.1 C)    TempSrc:  Oral    SpO2: 97% 99% 96% 98%  Weight:      Height:        Intake/Output Summary (Last 24 hours) at 05/20/2017 1346 Last data filed at 05/20/2017 1020 Gross per 24 hour  Intake  503 ml  Output -  Net 503 ml   Filed Weights   05/19/17 1817  Weight: 65.8 kg (145 lb)    Examination:  General exam: Appears calm and comfortable  Respiratory system: diminished on RLL, no wheezing Cardiovascular system: S1 & S2 heard, RRR. No JVD, murmurs, rubs, gallops or clicks. No pedal edema. Gastrointestinal system: Abdomen is nondistended, soft and nontender. No organomegaly or masses felt. Normal bowel sounds heard. Central nervous system: Alert and oriented. No focal neurological deficits. Extremities: Symmetric 5 x 5 power. Skin: No rashes, lesions or ulcers Psychiatry: Judgement and insight appear normal. Mood & affect appropriate.     Data Reviewed: I have personally reviewed following labs and imaging studies  CBC: Recent Labs  Lab 05/19/17 1841 05/20/17 0532  WBC 18.4* 18.4*  HGB 13.2 13.1  HCT 40.1 40.3  MCV 83.9 84.5  PLT 247 174   Basic Metabolic Panel: Recent Labs  Lab 05/19/17 1841 05/20/17 0532  NA 130* 138  K 3.6 3.9  CL 96* 103  CO2 24 25  GLUCOSE 154* 137*  BUN 10 11  CREATININE 1.65* 1.63*  CALCIUM 9.3 9.6   GFR: Estimated Creatinine Clearance: 31.5 mL/min (A) (by C-G formula based on SCr of 1.63 mg/dL (H)). Liver Function Tests: Recent Labs  Lab 05/19/17 1841 05/20/17 0532  AST 39 27  ALT 20 20  ALKPHOS 125 120  BILITOT 1.8* 1.3*  PROT 7.6 7.3  ALBUMIN 3.4* 3.2*   No results for input(s): LIPASE, AMYLASE in the last 168 hours. No results for input(s): AMMONIA in the last 168 hours. Coagulation Profile: No results for input(s): INR, PROTIME in the last 168 hours. Cardiac Enzymes: Recent Labs  Lab 05/20/17 0532  TROPONINI <0.03   BNP (last 3 results) No results for input(s): PROBNP in the last 8760 hours. HbA1C: No results for input(s): HGBA1C in the last 72 hours. CBG: No results for input(s): GLUCAP in the last 168 hours. Lipid Profile: No results for input(s): CHOL, HDL, LDLCALC, TRIG, CHOLHDL, LDLDIRECT in the  last 72 hours. Thyroid Function Tests: No results for input(s): TSH, T4TOTAL, FREET4, T3FREE, THYROIDAB in the last 72 hours. Anemia Panel: No results for input(s): VITAMINB12, FOLATE, FERRITIN, TIBC, IRON, RETICCTPCT in the last 72 hours. Urine analysis:    Component Value Date/Time   COLORURINE STRAW (A) 03/19/2017 2350   APPEARANCEUR CLEAR 03/19/2017 2350   LABSPEC 1.006 03/19/2017 2350   PHURINE 7.0 03/19/2017 2350   GLUCOSEU NEGATIVE 03/19/2017 2350   HGBUR NEGATIVE 03/19/2017 2350   BILIRUBINUR NEGATIVE 03/19/2017 2350   KETONESUR NEGATIVE 03/19/2017 2350   PROTEINUR NEGATIVE 03/19/2017 2350   NITRITE NEGATIVE 03/19/2017 2350   LEUKOCYTESUR NEGATIVE 03/19/2017 2350     )No results found for this or any previous visit (from the past 240 hour(s)).    Anti-infectives (From admission, onward)   Start     Dose/Rate Route Frequency Ordered Stop   05/20/17 2200  cefTRIAXone (ROCEPHIN) 1 g in dextrose 5 % 50 mL IVPB     1 g 100 mL/hr over 30 Minutes Intravenous Every 24 hours 05/19/17 2223     05/20/17 1000  doxycycline (VIBRA-TABS) tablet 100 mg     100 mg Oral Every 12 hours 05/19/17 2223     05/19/17 2145  cefTRIAXone (ROCEPHIN) 1 g in dextrose 5 % 50 mL IVPB     1 g 100 mL/hr over 30 Minutes Intravenous  Once 05/19/17 2136 05/19/17 2311   05/19/17 2145  doxycycline (VIBRA-TABS) tablet 100 mg     100 mg Oral  Once 05/19/17 2136 05/19/17 2221       Radiology Studies: Dg Chest 2 View  Result Date: 05/19/2017 CLINICAL DATA:  Patient with pain in the the right breast in shoulder. Initial encounter. EXAM: CHEST  2 VIEW COMPARISON:  Chest radiograph 03/19/2017. FINDINGS: Enlarged cardiac and mediastinal contours. Right mid lower lung consolidative opacities. No pleural effusion or pneumothorax. Thoracic spine degenerative changes. IMPRESSION: Right mid and lower lung consolidative opacities concerning for pneumonia in the appropriate clinical setting. Followup PA and lateral  chest X-ray is recommended in 3-4 weeks following trial of antibiotic therapy to ensure resolution and exclude underlying malignancy. Electronically Signed   By: Lovey Newcomer M.D.   On: 05/19/2017 21:57        Scheduled Meds: . acetaminophen  1,000 mg Oral Q8H  . amLODipine  10 mg Oral Daily  . doxycycline  100 mg Oral Q12H  . heparin  5,000 Units Subcutaneous Q8H  . sodium chloride flush  3 mL Intravenous Q12H   Continuous Infusions: . cefTRIAXone (ROCEPHIN)  IV       LOS: 0 days    Time spent: 35 min    Geradine Girt, DO [facility] Pager [phone]  If 7PM-7AM, please contact night-coverage [email or web address] Password [email or web address] 05/20/2017, 1:46 PM

## 2017-05-20 NOTE — ED Notes (Signed)
Pt tx to [facility] for holding

## 2017-05-20 NOTE — Progress Notes (Signed)
PT states she still unable to produce sputum-non collected on this shift

## 2017-05-20 NOTE — Progress Notes (Signed)
Patient states she is unable to produce sputum at this time-Will continue to re-evaluate

## 2017-05-20 NOTE — Progress Notes (Signed)
Patient arrived to floor from ED(5C06). Patient is A&O X4. She reports pain of 10 on a scale of 0-10.

## 2017-05-20 NOTE — Progress Notes (Signed)
Patient's BP this evening is 92/48.  MD notified Will continue to monitor

## 2017-05-21 DIAGNOSIS — Z87891 Personal history of nicotine dependence: Secondary | ICD-10-CM | POA: Diagnosis not present

## 2017-05-21 DIAGNOSIS — A419 Sepsis, unspecified organism: Secondary | ICD-10-CM | POA: Diagnosis present

## 2017-05-21 DIAGNOSIS — R112 Nausea with vomiting, unspecified: Secondary | ICD-10-CM | POA: Diagnosis not present

## 2017-05-21 DIAGNOSIS — J181 Lobar pneumonia, unspecified organism: Secondary | ICD-10-CM | POA: Diagnosis present

## 2017-05-21 DIAGNOSIS — I129 Hypertensive chronic kidney disease with stage 1 through stage 4 chronic kidney disease, or unspecified chronic kidney disease: Secondary | ICD-10-CM | POA: Diagnosis present

## 2017-05-21 DIAGNOSIS — Z79899 Other long term (current) drug therapy: Secondary | ICD-10-CM | POA: Diagnosis not present

## 2017-05-21 DIAGNOSIS — T364X5A Adverse effect of tetracyclines, initial encounter: Secondary | ICD-10-CM | POA: Diagnosis not present

## 2017-05-21 DIAGNOSIS — I10 Essential (primary) hypertension: Secondary | ICD-10-CM | POA: Diagnosis not present

## 2017-05-21 DIAGNOSIS — J159 Unspecified bacterial pneumonia: Secondary | ICD-10-CM | POA: Diagnosis present

## 2017-05-21 DIAGNOSIS — N183 Chronic kidney disease, stage 3 (moderate): Secondary | ICD-10-CM | POA: Diagnosis present

## 2017-05-21 DIAGNOSIS — E876 Hypokalemia: Secondary | ICD-10-CM

## 2017-05-21 LAB — CBC
HEMATOCRIT: 36 % (ref 36.0–46.0)
HEMOGLOBIN: 11.9 g/dL — AB (ref 12.0–15.0)
MCH: 27.2 pg (ref 26.0–34.0)
MCHC: 33.1 g/dL (ref 30.0–36.0)
MCV: 82.2 fL (ref 78.0–100.0)
Platelets: 283 10*3/uL (ref 150–400)
RBC: 4.38 MIL/uL (ref 3.87–5.11)
RDW: 14.1 % (ref 11.5–15.5)
WBC: 15.9 10*3/uL — AB (ref 4.0–10.5)

## 2017-05-21 LAB — BASIC METABOLIC PANEL
ANION GAP: 10 (ref 5–15)
BUN: 9 mg/dL (ref 6–20)
CALCIUM: 9.2 mg/dL (ref 8.9–10.3)
CHLORIDE: 102 mmol/L (ref 101–111)
CO2: 24 mmol/L (ref 22–32)
Creatinine, Ser: 1.35 mg/dL — ABNORMAL HIGH (ref 0.44–1.00)
GFR calc non Af Amer: 41 mL/min — ABNORMAL LOW (ref >60)
GFR, EST AFRICAN AMERICAN: 47 mL/min — AB (ref >60)
Glucose, Bld: 115 mg/dL — ABNORMAL HIGH (ref 65–99)
POTASSIUM: 3.3 mmol/L — AB (ref 3.5–5.1)
Sodium: 136 mmol/L (ref 135–145)

## 2017-05-21 MED ORDER — ONDANSETRON HCL 4 MG/2ML IJ SOLN: 4.0000 mg | Freq: Four times a day (QID) | INTRAMUSCULAR | Status: DC | PRN

## 2017-05-21 MED ORDER — ONDANSETRON HCL 4 MG/2ML IJ SOLN
INTRAMUSCULAR | Status: AC
  Administered 2017-05-21: 4 mg
  Filled 2017-05-21: qty 2

## 2017-05-21 MED ORDER — TRAMADOL HCL 50 MG PO TABS
50.0000 mg | ORAL_TABLET | Freq: Four times a day (QID) | ORAL | Status: DC | PRN
  Administered 2017-05-22: 50 mg via ORAL
  Filled 2017-05-21: qty 1

## 2017-05-21 MED ORDER — POTASSIUM CHLORIDE CRYS ER 20 MEQ PO TBCR
40.0000 meq | EXTENDED_RELEASE_TABLET | Freq: Once | ORAL | Status: AC
  Administered 2017-05-21: 40 meq via ORAL
  Filled 2017-05-21: qty 2

## 2017-05-21 NOTE — Care Management Note (Signed)
Case Management Note  Patient Details  Name: Rhonda Mcneil MRN: [id number] Date of Birth: 07/13/1955  Subjective/Objective:   Pt in with pneumonia. She is from home with family.               Action/Plan: Plan is for her to return home when medically ready. CM following for d/c needs, physician orders.  Expected Discharge Date:                  Expected Discharge Plan:  Home/Self Care  In-House Referral:     Discharge planning Services     Post Acute Care Choice:    Choice offered to:     DME Arranged:    DME Agency:     HH Arranged:    HH Agency:     Status of Service:  In process, will continue to follow  If discussed at Long Length of Stay Meetings, dates discussed:    Additional Comments:  Pollie Friar, RN 05/21/2017, 1:54 PM

## 2017-05-21 NOTE — Progress Notes (Signed)
PROGRESS NOTE    Avalin Briley  NGE:[id number] DOB: Apr 21, 1956 DOA: 05/19/2017 PCP: Patient, No Pcp Per   Outpatient Specialists:    Brief Narrative:  61 year old woman with hypertension and chronic kidney disease stage III (bCR ~1.5) presenting with right-sided chest pain in the setting of productive cough, fever, chills. Onset was 12-24 hrs. ago, describes the pain as sharp and located in the right side of her chest, radiates to her right armpit, worse with taking a deep breath, weight is 9 out of 10. Denies any nausea vomiting or diarrhea. Reports being diagnosed pneumonia years ago but has not been recently ill. No hemoptysis or shortness of breath.    Assessment & Plan:   Active Problems:   Pneumonia   Sepsis due to pneumonia, bacterial likely - fever to 102.79F, increased RR, with leukocytosis, source suspected lung X ray: Right mid and lower lung consolidative opacities concerning for pneumonia in the appropriate clinical setting. Followup PA and lateral chest X-ray is recommended in 3-4 weeks following trial of antibiotic therapy  (ED physician ordered ceftriaxone + doxycycline) -NP swab/flub swab negative -no blood cultures done prior to administration of abx-- will order if fever >100.4  R sided pleuritic CP: in the setting of pneumonia, likely that pneumonia is underlying cause -tramadol PRN  CKD3: monitor  HTN: resume home meds  Mildly elevated T bili: suspect related to sepsis -improved this AM  Hypokalemia -replace    DVT prophylaxis:  SQ Heparin  Code Status: Full Code   Family Communication:   Disposition Plan:     Consultants:      Subjective: Oxycodone too strong -not eating much -still with low grade fever   Objective: Vitals:   05/20/17 2117 05/21/17 0030 05/21/17 0429 05/21/17 0938  BP: 121/68 (!) 150/66 135/88 126/70  Pulse: (!) 54 71 73 72  Resp: 18 18 18 17   Temp: 98.2 F (36.8 C) 99.2 F (37.3 C) 100 F (37.8 C) 99.4  F (37.4 C)  TempSrc: Oral Oral Oral Oral  SpO2: 99% 100% 100% 97%  Weight:      Height:        Intake/Output Summary (Last 24 hours) at 05/21/2017 1322 Last data filed at 05/21/2017 0300 Gross per 24 hour  Intake 1108.33 ml  Output -  Net 1108.33 ml   Filed Weights   05/19/17 1817  Weight: 65.8 kg (145 lb)    Examination:  General exam: in bed, NAD Respiratory system: no wheezing Cardiovascular system: rrr Gastrointestinal system: +Bs, soft Central nervous system: alert Extremities: moves all 4 ext     Data Reviewed: I have personally reviewed following labs and imaging studies  CBC: Recent Labs  Lab 05/19/17 1841 05/20/17 0532 05/21/17 0756  WBC 18.4* 18.4* 15.9*  HGB 13.2 13.1 11.9*  HCT 40.1 40.3 36.0  MCV 83.9 84.5 82.2  PLT 247 258 401   Basic Metabolic Panel: Recent Labs  Lab 05/19/17 1841 05/20/17 0532 05/21/17 0756  NA 130* 138 136  K 3.6 3.9 3.3*  CL 96* 103 102  CO2 24 25 24   GLUCOSE 154* 137* 115*  BUN 10 11 9   CREATININE 1.65* 1.63* 1.35*  CALCIUM 9.3 9.6 9.2   GFR: Estimated Creatinine Clearance: 38 mL/min (A) (by C-G formula based on SCr of 1.35 mg/dL (H)). Liver Function Tests: Recent Labs  Lab 05/19/17 1841 05/20/17 0532  AST 39 27  ALT 20 20  ALKPHOS 125 120  BILITOT 1.8* 1.3*  PROT 7.6 7.3  ALBUMIN 3.4*  3.2*   No results for input(s): LIPASE, AMYLASE in the last 168 hours. No results for input(s): AMMONIA in the last 168 hours. Coagulation Profile: No results for input(s): INR, PROTIME in the last 168 hours. Cardiac Enzymes: Recent Labs  Lab 05/20/17 0532  TROPONINI <0.03   BNP (last 3 results) No results for input(s): PROBNP in the last 8760 hours. HbA1C: No results for input(s): HGBA1C in the last 72 hours. CBG: No results for input(s): GLUCAP in the last 168 hours. Lipid Profile: No results for input(s): CHOL, HDL, LDLCALC, TRIG, CHOLHDL, LDLDIRECT in the last 72 hours. Thyroid Function Tests: No  results for input(s): TSH, T4TOTAL, FREET4, T3FREE, THYROIDAB in the last 72 hours. Anemia Panel: No results for input(s): VITAMINB12, FOLATE, FERRITIN, TIBC, IRON, RETICCTPCT in the last 72 hours. Urine analysis:    Component Value Date/Time   COLORURINE STRAW (A) 03/19/2017 2350   APPEARANCEUR CLEAR 03/19/2017 2350   LABSPEC 1.006 03/19/2017 2350   PHURINE 7.0 03/19/2017 2350   GLUCOSEU NEGATIVE 03/19/2017 2350   HGBUR NEGATIVE 03/19/2017 2350   BILIRUBINUR NEGATIVE 03/19/2017 2350   KETONESUR NEGATIVE 03/19/2017 2350   PROTEINUR NEGATIVE 03/19/2017 2350   NITRITE NEGATIVE 03/19/2017 2350   LEUKOCYTESUR NEGATIVE 03/19/2017 2350     ) Recent Results (from the past 240 hour(s))  Respiratory Panel by PCR     Status: None   Collection Time: 05/20/17 12:15 PM  Result Value Ref Range Status   Adenovirus NOT DETECTED NOT DETECTED Final   Coronavirus 229E NOT DETECTED NOT DETECTED Final   Coronavirus HKU1 NOT DETECTED NOT DETECTED Final   Coronavirus NL63 NOT DETECTED NOT DETECTED Final   Coronavirus OC43 NOT DETECTED NOT DETECTED Final   Metapneumovirus NOT DETECTED NOT DETECTED Final   Rhinovirus / Enterovirus NOT DETECTED NOT DETECTED Final   Influenza A NOT DETECTED NOT DETECTED Final   Influenza B NOT DETECTED NOT DETECTED Final   Parainfluenza Virus 1 NOT DETECTED NOT DETECTED Final   Parainfluenza Virus 2 NOT DETECTED NOT DETECTED Final   Parainfluenza Virus 3 NOT DETECTED NOT DETECTED Final   Parainfluenza Virus 4 NOT DETECTED NOT DETECTED Final   Respiratory Syncytial Virus NOT DETECTED NOT DETECTED Final   Bordetella pertussis NOT DETECTED NOT DETECTED Final   Chlamydophila pneumoniae NOT DETECTED NOT DETECTED Final   Mycoplasma pneumoniae NOT DETECTED NOT DETECTED Final  Culture, sputum-assessment     Status: None (Preliminary result)   Collection Time: 05/21/17  7:45 AM  Result Value Ref Range Status   Specimen Description SPUTUM  Final   Special Requests NONE   Final   Sputum evaluation   Final    Sputum specimen not acceptable for testing.  Please recollect.   RESULT CALLED TO, READ BACK BY AND VERIFIED WITH: Cain Saupe RN 12:10 05/21/17 ([email or web address])    Report Status PENDING  Incomplete      Anti-infectives (From admission, onward)   Start     Dose/Rate Route Frequency Ordered Stop   05/20/17 2200  cefTRIAXone (ROCEPHIN) 1 g in dextrose 5 % 50 mL IVPB     1 g 100 mL/hr over 30 Minutes Intravenous Every 24 hours 05/19/17 2223     05/20/17 1000  doxycycline (VIBRA-TABS) tablet 100 mg     100 mg Oral Every 12 hours 05/19/17 2223     05/19/17 2145  cefTRIAXone (ROCEPHIN) 1 g in dextrose 5 % 50 mL IVPB     1 g 100 mL/hr over 30 Minutes Intravenous  Once  05/19/17 2136 05/19/17 2311   05/19/17 2145  doxycycline (VIBRA-TABS) tablet 100 mg     100 mg Oral  Once 05/19/17 2136 05/19/17 2221       Radiology Studies: Dg Chest 2 View  Result Date: 05/19/2017 CLINICAL DATA:  Patient with pain in the the right breast in shoulder. Initial encounter. EXAM: CHEST  2 VIEW COMPARISON:  Chest radiograph 03/19/2017. FINDINGS: Enlarged cardiac and mediastinal contours. Right mid lower lung consolidative opacities. No pleural effusion or pneumothorax. Thoracic spine degenerative changes. IMPRESSION: Right mid and lower lung consolidative opacities concerning for pneumonia in the appropriate clinical setting. Followup PA and lateral chest X-ray is recommended in 3-4 weeks following trial of antibiotic therapy to ensure resolution and exclude underlying malignancy. Electronically Signed   By: Lovey Newcomer M.D.   On: 05/19/2017 21:57        Scheduled Meds: . amLODipine  5 mg Oral Daily  . doxycycline  100 mg Oral Q12H  . heparin  5,000 Units Subcutaneous Q8H  . potassium chloride  40 mEq Oral Once  . sodium chloride flush  3 mL Intravenous Q12H   Continuous Infusions: . cefTRIAXone (ROCEPHIN)  IV Stopped (05/20/17 2220)     LOS: 0 days    Time spent: 35  min    Geradine Girt, DO [facility] Pager [phone]  If 7PM-7AM, please contact night-coverage [email or web address] Password [email or web address] 05/21/2017, 1:22 PM

## 2017-05-22 LAB — EXPECTORATED SPUTUM ASSESSMENT W GRAM STAIN, RFLX TO RESP C

## 2017-05-22 LAB — BASIC METABOLIC PANEL
Anion gap: 9 (ref 5–15)
BUN: 10 mg/dL (ref 6–20)
CALCIUM: 9.6 mg/dL (ref 8.9–10.3)
CHLORIDE: 101 mmol/L (ref 101–111)
CO2: 25 mmol/L (ref 22–32)
CREATININE: 1.39 mg/dL — AB (ref 0.44–1.00)
GFR calc non Af Amer: 39 mL/min — ABNORMAL LOW (ref >60)
GFR, EST AFRICAN AMERICAN: 46 mL/min — AB (ref >60)
Glucose, Bld: 143 mg/dL — ABNORMAL HIGH (ref 65–99)
Potassium: 3.3 mmol/L — ABNORMAL LOW (ref 3.5–5.1)
SODIUM: 135 mmol/L (ref 135–145)

## 2017-05-22 LAB — CBC
HEMATOCRIT: 35.2 % — AB (ref 36.0–46.0)
HEMOGLOBIN: 11.4 g/dL — AB (ref 12.0–15.0)
MCH: 26.7 pg (ref 26.0–34.0)
MCHC: 32.4 g/dL (ref 30.0–36.0)
MCV: 82.4 fL (ref 78.0–100.0)
Platelets: 342 10*3/uL (ref 150–400)
RBC: 4.27 MIL/uL (ref 3.87–5.11)
RDW: 14.2 % (ref 11.5–15.5)
WBC: 13.5 10*3/uL — AB (ref 4.0–10.5)

## 2017-05-22 LAB — EXPECTORATED SPUTUM ASSESSMENT W REFEX TO RESP CULTURE

## 2017-05-22 MED ORDER — POTASSIUM CHLORIDE CRYS ER 20 MEQ PO TBCR
40.0000 meq | EXTENDED_RELEASE_TABLET | Freq: Once | ORAL | Status: AC
  Administered 2017-05-22: 40 meq via ORAL
  Filled 2017-05-22: qty 2

## 2017-05-22 MED ORDER — LEVOFLOXACIN 500 MG PO TABS
500.0000 mg | ORAL_TABLET | Freq: Every day | ORAL | Status: DC
  Administered 2017-05-22: 500 mg via ORAL
  Filled 2017-05-22: qty 1

## 2017-05-22 MED ORDER — LEVOFLOXACIN 500 MG PO TABS: 500.0000 mg | ORAL_TABLET | Freq: Every day | ORAL | 0 refills | Status: DC

## 2017-05-22 MED ORDER — RISAQUAD PO CAPS
1.0000 | ORAL_CAPSULE | Freq: Every day | ORAL | Status: DC
  Administered 2017-05-22: 1 via ORAL
  Filled 2017-05-22: qty 1

## 2017-05-22 MED ORDER — TRAMADOL HCL 50 MG PO TABS: 50.0000 mg | ORAL_TABLET | Freq: Four times a day (QID) | ORAL | 0 refills | Status: DC | PRN

## 2017-05-22 MED ORDER — RISAQUAD PO CAPS: 1.0000 | ORAL_CAPSULE | Freq: Every day | ORAL | 0 refills | Status: DC

## 2017-05-22 NOTE — Progress Notes (Signed)
Patient made aware of discharge. Patient states that her daughter is at work. Pt stated that she will try her son and see if he can pick her up.

## 2017-05-22 NOTE — Discharge Summary (Signed)
Physician Discharge Summary  Rubie Ficco MBW:[id number] DOB: Mar 30, 1956 DOA: 05/19/2017  PCP: Patient, No Pcp Per  Admit date: 05/19/2017 Discharge date: 05/22/2017   Recommendations for Outpatient Follow-Up:   1. Repeat x ray in 6 weeks to be sure PNA has cleared 2. BMP at next office visit 3. Holding your combination BP medication for lower BP with the PNA--please have PCP check your BP And resume as necessary   Discharge Diagnosis:   Active Problems:   Pneumonia   Discharge disposition:  Home  Discharge Condition: Improved.  Diet recommendation: Low sodium, heart healthy.  Carbohydrate-modified  Wound care: None.   History of Present Illness:   61 year old woman with hypertension and chronic kidney disease stage IIIB (bCR ~1.5) presenting with right-sided chest pain in the setting of productive cough, fever, chills. Onset was 12-24 hrs. ago, describes the pain as sharp and located in the right side of her chest, radiates to her right armpit, worse with taking a deep breath, weight is 9 out of 10. Denies any nausea vomiting or diarrhea. Reports being diagnosed pneumonia years ago but has not been recently ill. No hemoptysis or shortness of breath.  She reports she takes medication for blood pressure and that she knows she has some degree of chronic kidney disease. She denies any syncope, dizziness, abdominal pain.  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     Hospital Course by Problem:   Sepsis due to pneumonia, bacterial likely - fever to 102.20F, increased RR, with leukocytosis, source suspected lung X ray: Right mid and lower lung consolidative opacities concerning for pneumonia in the appropriate clinical setting. Followup PA  and lateral chest X-ray is recommended in 3-4 weeks following trial of antibiotic therapy  (ED physician ordered ceftriaxone + doxycycline)-- this has caused patient nausea-- change to PO levaquin -NP swab/flub swab negative -no blood cultures done prior to administration of abx  R sided pleuritic CP: in the setting of pneumonia, likely that pneumonia is underlying cause -tramadol PRN with improvement  CKD3: monitor  HTN: resume home meds  Mildly elevated T bili: suspect related to sepsis -improved this AM  Hypokalemia -replace      Medical Consultants:    None.   Discharge Exam:   Vitals:   05/22/17 0926 05/22/17 1046  BP: 114/70 134/79  Pulse:  (!) 59  Resp:  18  Temp:  98.9 F (37.2 C)  SpO2:  99%   Vitals:   05/22/17 0123 05/22/17 0455 05/22/17 0926 05/22/17 1046  BP: 128/69 (!) 142/83 114/70 134/79  Pulse: 78 73  (!) 59  Resp: 18 18  18   Temp: 99.6 F (37.6 C) 99.1 F (37.3 C)  98.9 F (37.2 C)  TempSrc: Oral Oral  Oral  SpO2: 95% 99%  99%  Weight:      Height:        Gen:  NAD   The results of significant diagnostics from this hospitalization (including imaging, microbiology, ancillary and laboratory) are listed below for reference.     Procedures and Diagnostic Studies:   Dg Chest 2 View  Result Date: 05/19/2017 CLINICAL DATA:  Patient with pain in the the right breast in shoulder. Initial encounter. EXAM: CHEST  2 VIEW COMPARISON:  Chest radiograph 03/19/2017. FINDINGS: Enlarged cardiac  and mediastinal contours. Right mid lower lung consolidative opacities. No pleural effusion or pneumothorax. Thoracic spine degenerative changes. IMPRESSION: Right mid and lower lung consolidative opacities concerning for pneumonia in the appropriate clinical setting. Followup PA and lateral chest X-ray is recommended in 3-4 weeks following trial of antibiotic therapy to ensure resolution and exclude underlying malignancy. Electronically Signed   By: Lovey Newcomer M.D.   On: 05/19/2017 21:57     Labs:   Basic Metabolic Panel: Recent Labs  Lab 05/19/17 1841 05/20/17 0532 05/21/17 0756 05/22/17 0954  NA 130* 138 136 135  K 3.6 3.9 3.3* 3.3*  CL 96* 103 102 101  CO2 24 25 24 25   GLUCOSE 154* 137* 115* 143*  BUN 10 11 9 10   CREATININE 1.65* 1.63* 1.35* 1.39*  CALCIUM 9.3 9.6 9.2 9.6   GFR Estimated Creatinine Clearance: 36.9 mL/min (A) (by C-G formula based on SCr of 1.39 mg/dL (H)). Liver Function Tests: Recent Labs  Lab 05/19/17 1841 05/20/17 0532  AST 39 27  ALT 20 20  ALKPHOS 125 120  BILITOT 1.8* 1.3*  PROT 7.6 7.3  ALBUMIN 3.4* 3.2*   No results for input(s): LIPASE, AMYLASE in the last 168 hours. No results for input(s): AMMONIA in the last 168 hours. Coagulation profile No results for input(s): INR, PROTIME in the last 168 hours.  CBC: Recent Labs  Lab 05/19/17 1841 05/20/17 0532 05/21/17 0756 05/22/17 0954  WBC 18.4* 18.4* 15.9* 13.5*  HGB 13.2 13.1 11.9* 11.4*  HCT 40.1 40.3 36.0 35.2*  MCV 83.9 84.5 82.2 82.4  PLT 247 258 283 342   Cardiac Enzymes: Recent Labs  Lab 05/20/17 0532  TROPONINI <0.03   BNP: Invalid input(s): POCBNP CBG: No results for input(s): GLUCAP in the last 168 hours. D-Dimer No results for input(s): DDIMER in the last 72 hours. Hgb A1c No results for input(s): HGBA1C in the last 72 hours. Lipid Profile No results for input(s): CHOL, HDL, LDLCALC, TRIG, CHOLHDL, LDLDIRECT in the last 72 hours. Thyroid function studies No results for input(s): TSH, T4TOTAL, T3FREE, THYROIDAB in the last 72 hours.  Invalid input(s): FREET3 Anemia work up No results for input(s): VITAMINB12, FOLATE, FERRITIN, TIBC, IRON, RETICCTPCT in the last 72 hours. Microbiology Recent Results (from the past 240 hour(s))  Respiratory Panel by PCR     Status: None   Collection Time: 05/20/17 12:15 PM  Result Value Ref Range Status   Adenovirus NOT DETECTED NOT DETECTED Final   Coronavirus 229E NOT  DETECTED NOT DETECTED Final   Coronavirus HKU1 NOT DETECTED NOT DETECTED Final   Coronavirus NL63 NOT DETECTED NOT DETECTED Final   Coronavirus OC43 NOT DETECTED NOT DETECTED Final   Metapneumovirus NOT DETECTED NOT DETECTED Final   Rhinovirus / Enterovirus NOT DETECTED NOT DETECTED Final   Influenza A NOT DETECTED NOT DETECTED Final   Influenza B NOT DETECTED NOT DETECTED Final   Parainfluenza Virus 1 NOT DETECTED NOT DETECTED Final   Parainfluenza Virus 2 NOT DETECTED NOT DETECTED Final   Parainfluenza Virus 3 NOT DETECTED NOT DETECTED Final   Parainfluenza Virus 4 NOT DETECTED NOT DETECTED Final   Respiratory Syncytial Virus NOT DETECTED NOT DETECTED Final   Bordetella pertussis NOT DETECTED NOT DETECTED Final   Chlamydophila pneumoniae NOT DETECTED NOT DETECTED Final   Mycoplasma pneumoniae NOT DETECTED NOT DETECTED Final  Culture, sputum-assessment     Status: None (Preliminary result)   Collection Time: 05/21/17  7:45 AM  Result Value Ref Range Status  Specimen Description SPUTUM  Final   Special Requests NONE  Final   Sputum evaluation   Final    Sputum specimen not acceptable for testing.  Please recollect.   RESULT CALLED TO, READ BACK BY AND VERIFIED WITH: Cain Saupe RN 12:10 05/21/17 ([email or web address])    Report Status PENDING  Incomplete     Discharge Instructions:   Discharge Instructions    Diet - low sodium heart healthy   Complete by:  As directed    Increase activity slowly   Complete by:  As directed      Allergies as of 05/22/2017      Reactions   [place] Venom Anaphylaxis      Medication List    STOP taking these medications   atenolol-chlorthalidone 100-25 MG tablet Commonly known as:  TENORETIC   labetalol 300 MG tablet Commonly known as:  NORMODYNE   lisinopril 40 MG tablet Commonly known as:  PRINIVIL,ZESTRIL     TAKE these medications   acidophilus Caps capsule Take 1 capsule by mouth daily. Start taking on:  05/23/2017   amLODipine 10 MG  tablet Commonly known as:  NORVASC Take 1 tablet (10 mg total) by mouth daily.   gabapentin 300 MG capsule Commonly known as:  NEURONTIN Take 300 mg by mouth at bedtime as needed for pain.   levofloxacin 500 MG tablet Commonly known as:  LEVAQUIN Take 1 tablet (500 mg total) by mouth daily. Start taking on:  05/23/2017   traMADol 50 MG tablet Commonly known as:  ULTRAM Take 1 tablet (50 mg total) by mouth every 6 (six) hours as needed for moderate pain.      Follow-up Information    PCP 1 week Follow up.            Time coordinating discharge: 35 min  Signed:  Geradine Girt   [facility] 05/22/2017, 2:16 PM

## 2017-05-22 NOTE — Care Management Note (Signed)
Case Management Note  Patient Details  Name: Rhonda Mcneil MRN: [id number] Date of Birth: 04-Aug-1955  Subjective/Objective:                    Action/Plan: Pt sees Dr Vista Lawman on [facility] for her PCP. She lives with her son and is IADL. Pt will d/c home with self care. Pt has insurance and transportation home. No further needs per CM.  Expected Discharge Date:                  Expected Discharge Plan:  Home/Self Care  In-House Referral:     Discharge planning Services     Post Acute Care Choice:    Choice offered to:     DME Arranged:    DME Agency:     HH Arranged:    [place] Agency:     Status of Service:  Completed, signed off  If discussed at [employer] of Stay Meetings, dates discussed:    Additional Comments:  Pollie Friar, RN 05/22/2017, 10:32 AM

## 2017-05-22 NOTE — Progress Notes (Signed)
Patient ambulated in the hall. Patient walked to the nurses station and back. Patient tolerated well.

## 2017-05-22 NOTE — Progress Notes (Signed)
Per nursing and patient, doxycycline making patient nauseaus.  Will place on levaquin-- possible side effects discussed with patient.  AM labs ordered.  Will ambulate and hope for d/c this PM Eulogio Bear DO

## 2017-06-24 ENCOUNTER — Emergency Department (HOSPITAL_BASED_OUTPATIENT_CLINIC_OR_DEPARTMENT_OTHER)
Admit: 2017-06-24 | Discharge: 2017-06-24 | Disposition: A | Discharge status: Home / Self Care | Payer: BLUE CROSS/BLUE SHIELD | Attending: Emergency Medicine | Admitting: Emergency Medicine

## 2017-06-24 ENCOUNTER — Encounter (HOSPITAL_COMMUNITY): Payer: Self-pay

## 2017-06-24 ENCOUNTER — Other Ambulatory Visit: Payer: Self-pay

## 2017-06-24 ENCOUNTER — Emergency Department (HOSPITAL_COMMUNITY)
Admission: EM | Admit: 2017-06-24 | Discharge: 2017-06-25 | Disposition: A | Discharge status: Home / Self Care | Payer: BLUE CROSS/BLUE SHIELD | Attending: Emergency Medicine | Admitting: Emergency Medicine

## 2017-06-24 DIAGNOSIS — I824Z1 Acute embolism and thrombosis of unspecified deep veins of right distal lower extremity: Secondary | ICD-10-CM

## 2017-06-24 DIAGNOSIS — Z91030 Bee allergy status: Secondary | ICD-10-CM | POA: Insufficient documentation

## 2017-06-24 DIAGNOSIS — Z79899 Other long term (current) drug therapy: Secondary | ICD-10-CM | POA: Diagnosis not present

## 2017-06-24 DIAGNOSIS — I10 Essential (primary) hypertension: Secondary | ICD-10-CM | POA: Insufficient documentation

## 2017-06-24 DIAGNOSIS — R2241 Localized swelling, mass and lump, right lower limb: Secondary | ICD-10-CM | POA: Insufficient documentation

## 2017-06-24 DIAGNOSIS — M7989 Other specified soft tissue disorders: Secondary | ICD-10-CM

## 2017-06-24 DIAGNOSIS — M25561 Pain in right knee: Secondary | ICD-10-CM | POA: Diagnosis present

## 2017-06-24 HISTORY — DX: Disorder of kidney and ureter, unspecified: N28.9

## 2017-06-24 HISTORY — DX: Acute embolism and thrombosis of unspecified deep veins of unspecified lower extremity: I82.409

## 2017-06-24 HISTORY — DX: Deep phlebothrombosis in pregnancy, unspecified trimester: O22.30

## 2017-06-24 LAB — CBC WITH DIFFERENTIAL/PLATELET
BASOS ABS: 0 10*3/uL (ref 0.0–0.1)
BASOS PCT: 0 %
Eosinophils Absolute: 0.4 10*3/uL (ref 0.0–0.7)
Eosinophils Relative: 4 %
HEMATOCRIT: 32.6 % — AB (ref 36.0–46.0)
HEMOGLOBIN: 10.8 g/dL — AB (ref 12.0–15.0)
Lymphocytes Relative: 38 %
Lymphs Abs: 3.7 10*3/uL (ref 0.7–4.0)
MCH: 27.6 pg (ref 26.0–34.0)
MCHC: 33.1 g/dL (ref 30.0–36.0)
MCV: 83.4 fL (ref 78.0–100.0)
MONO ABS: 0.7 10*3/uL (ref 0.1–1.0)
Monocytes Relative: 7 %
NEUTROS ABS: 4.9 10*3/uL (ref 1.7–7.7)
NEUTROS PCT: 51 %
Platelets: 371 10*3/uL (ref 150–400)
RBC: 3.91 MIL/uL (ref 3.87–5.11)
RDW: 15.9 % — AB (ref 11.5–15.5)
WBC: 9.7 10*3/uL (ref 4.0–10.5)

## 2017-06-24 LAB — BASIC METABOLIC PANEL
ANION GAP: 8 (ref 5–15)
BUN: 10 mg/dL (ref 6–20)
CALCIUM: 9 mg/dL (ref 8.9–10.3)
CO2: 25 mmol/L (ref 22–32)
Chloride: 109 mmol/L (ref 101–111)
Creatinine, Ser: 1.46 mg/dL — ABNORMAL HIGH (ref 0.44–1.00)
GFR calc non Af Amer: 37 mL/min — ABNORMAL LOW (ref >60)
GFR, EST AFRICAN AMERICAN: 43 mL/min — AB (ref >60)
Glucose, Bld: 127 mg/dL — ABNORMAL HIGH (ref 65–99)
Potassium: 3.3 mmol/L — ABNORMAL LOW (ref 3.5–5.1)
SODIUM: 142 mmol/L (ref 135–145)

## 2017-06-24 MED ORDER — RIVAROXABAN 15 MG PO TABS
15.0000 mg | ORAL_TABLET | Freq: Once | ORAL | Status: AC
  Administered 2017-06-24: 15 mg via ORAL
  Filled 2017-06-24: qty 1

## 2017-06-24 MED ORDER — RIVAROXABAN (XARELTO) VTE STARTER PACK (15 & 20 MG)
ORAL_TABLET | ORAL | 0 refills | Status: DC
Start: 2017-06-24 — End: 2018-03-06

## 2017-06-24 NOTE — Discharge Instructions (Signed)
Schedule appointment for recheck with your primary MD.  Return if any problems.  Information on my medicine - XARELTO (rivaroxaban)  This medication education was reviewed with me or my healthcare representative as part of my discharge preparation.  The pharmacist that spoke with me during my hospital stay was:  Parmvir Boomer A, [place]? Xarelto was prescribed to treat blood clots that may have been found in the veins of your legs (deep vein thrombosis) or in your lungs (pulmonary embolism) and to reduce the risk of them occurring again.  What do you need to know about Xarelto? The starting dose is one 15 mg tablet taken TWICE daily with food for the FIRST 21 DAYS then on (enter date)  07/15/2017  the dose is changed to one 20 mg tablet taken ONCE A DAY with your evening meal.  DO NOT stop taking Xarelto without talking to the health care provider who prescribed the medication.  Refill your prescription for 20 mg tablets before you run out.  After discharge, you should have regular check-up appointments with your healthcare provider that is prescribing your Xarelto.  In the future your dose may need to be changed if your kidney function changes by a significant amount.  What do you do if you miss a dose? If you are taking Xarelto TWICE DAILY and you miss a dose, take it as soon as you remember. You may take two 15 mg tablets (total 30 mg) at the same time then resume your regularly scheduled 15 mg twice daily the next day.  If you are taking Xarelto ONCE DAILY and you miss a dose, take it as soon as you remember on the same day then continue your regularly scheduled once daily regimen the next day. Do not take two doses of Xarelto at the same time.   Important Safety Information Xarelto is a blood thinner medicine that can cause bleeding. You should call your healthcare provider right away if you experience any of the following: ? Bleeding from an  injury or your nose that does not stop. ? Unusual colored urine (red or dark [place]) or unusual colored stools (red or black). ? Unusual bruising for unknown reasons. ? A serious fall or if you hit your head (even if there is no bleeding).  Some medicines may interact with Xarelto and might increase your risk of bleeding while on Xarelto. To help avoid this, consult your healthcare provider or pharmacist prior to using any new prescription or non-prescription medications, including herbals, vitamins, non-steroidal anti-inflammatory drugs (NSAIDs) and supplements.  This website has more information on Xarelto: [email or web address]-benson.[email or web address]

## 2017-06-24 NOTE — Progress Notes (Addendum)
Right lower extremity venous duplex completed. Positive for an acute occlusive DVT noted coursing from the posterior tibial vein, mid peroneal, popliteal, femoral, and near occlusion of the common femoral vein. The iliac was difficult to image but appears patent at this time. There is no propagation to the left. There appears to be no evidence of a superficial thrombosis, or Baker's cyst. Toma Copier, RVS 06/24/2017 9:38 PM

## 2017-06-24 NOTE — ED Provider Notes (Signed)
[facility] DEPT Provider Note   CSN: [id number] Arrival date & time: 06/24/17  1341     History   Chief Complaint Chief Complaint  Patient presents with  . Knee Pain  . Leg Swelling    HPI Rhonda Mcneil is a 61 y.o. female.  The history is provided by the patient. No language interpreter was used.  Knee Pain   This is a new problem. The current episode started more than 1 week ago. The problem occurs constantly. The problem has been gradually worsening. The quality of the pain is described as aching. The pain is moderate. Pertinent negatives include full range of motion. She has tried nothing for the symptoms. The treatment provided no relief. There has been no history of extremity trauma.  Pt complains that she thinks she has a blood clot in her leg.  Pt reports she had one in the past.  Pt reports she recently traveled to [place] and was sitting for several hours.   Past Medical History:  Diagnosis Date  . 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 DVT (deep venous thrombosis) ([place])   . Hypertension   . Renal disorder     Patient Active Problem List   Diagnosis Date Noted  . Pneumonia 05/19/2017    Past Surgical History:  Procedure Laterality Date  . CARPAL TUNNEL RELEASE      OB History    No data available       Home Medications    Prior to Admission medications   Medication Sig Start Date End Date Taking? Authorizing Provider  amLODipine (NORVASC) 10 MG tablet Take 1 tablet (10 mg total) by mouth daily. 03/20/17  Yes Quentin Cornwall, [place] N, PA-C  atenolol-chlorthalidone (TENORETIC) 100-25 MG tablet Take 1 tablet by mouth daily. 06/20/17  Yes [provider]  cholecalciferol (VITAMIN D) 1000 units tablet Take 1,000 Units by mouth daily.   Yes [provider]  vitamin B-12 (CYANOCOBALAMIN) 1000 MCG tablet Take 1,000 mcg by mouth daily.   Yes [provider]    Family History Family History    Problem Relation Age of Onset  . Cancer Mother     Social History Social History   Tobacco Use  . Smoking status: Never Smoker  . Smokeless tobacco: Never Used  Substance Use Topics  . Alcohol use: Yes  . Drug use: No     Allergies   Bee venom   Review of Systems Review of Systems  All other systems reviewed and are negative.    Physical Exam Updated Vital Signs BP 136/73 (BP Location: Left Arm)   Pulse 65   Temp 98 F (36.7 C) (Oral)   Resp 18   Ht 5\' 2"  (1.575 m)   Wt 65.8 kg (145 lb)   SpO2 99%   BMI 26.52 kg/m   Physical Exam  Constitutional: She appears well-developed and well-nourished. No distress.  HENT:  Head: Normocephalic and atraumatic.  Eyes: Conjunctivae are normal.  Neck: Neck supple.  Cardiovascular: Normal rate and regular rhythm.  No murmur heard. Pulmonary/Chest: Effort normal and breath sounds normal. No respiratory distress.  Abdominal: There is no tenderness.  Musculoskeletal: She exhibits edema and tenderness.  Positive homan's right leg.   Neurological: She is alert.  Skin: Skin is warm and dry.  Psychiatric: She has a normal mood and affect.  Nursing note and vitals reviewed.    ED Treatments / Results  Labs (all labs ordered are listed,  but only abnormal results are displayed) Labs Reviewed  CBC WITH DIFFERENTIAL/PLATELET  BASIC METABOLIC PANEL    EKG  EKG Interpretation None       Radiology No results found.  Procedures Procedures (including critical care time)  Medications Ordered in ED Medications - No data to display   Initial Impression / Assessment and Plan / ED Course  I have reviewed the triage vital signs and the nursing notes.  Pertinent labs & imaging results that were available during my care of the patient were reviewed by me and considered in my medical decision making (see chart for details).       Final Clinical Impressions(s) / ED Diagnoses   Final diagnoses:  DVT, lower  extremity, distal, acute, right [facility])    ED Discharge Orders        Ordered    Rivaroxaban 15 & 20 MG TBPK     06/24/17 2206    An After Visit Summary was printed and given to the patient.   Fransico Meadow, PA-C 06/24/17 2209    Fransico Meadow, [place] 06/24/17 2243    Charlesetta Shanks, MD 06/28/17 1755

## 2017-06-24 NOTE — ED Triage Notes (Signed)
Patient c/o intermittent right knee pain and right leg swelling since 06/08/17. Patient denies any injury to the right knee. Patient has a history of DVT of the right leg 2 years ago.

## 2017-06-27 ENCOUNTER — Encounter: Payer: Self-pay | Admitting: Vascular Surgery

## 2017-06-27 ENCOUNTER — Other Ambulatory Visit: Payer: Self-pay | Admitting: *Deleted

## 2017-06-27 ENCOUNTER — Ambulatory Visit (INDEPENDENT_AMBULATORY_CARE_PROVIDER_SITE_OTHER): Payer: No Typology Code available for payment source | Admitting: Vascular Surgery

## 2017-06-27 ENCOUNTER — Encounter: Payer: Self-pay | Admitting: *Deleted

## 2017-06-27 VITALS — BP 128/87 | HR 67 | Temp 97.7°F | Resp 20 | Ht 62.0 in | Wt 148.0 lb

## 2017-06-27 DIAGNOSIS — I82411 Acute embolism and thrombosis of right femoral vein: Secondary | ICD-10-CM

## 2017-06-27 NOTE — Progress Notes (Signed)
[facility]  Patient name: Rhonda Mcneil MRN: [id number] DOB: 09/17/1955 Sex: female  REASON FOR CONSULT: Acute DVT right leg  HPI: Rhonda Mcneil is a 62 y.o. female, who is seen for evaluation of right leg DVT.  She presented to the hospital on 06/24/2017 with pain and swelling in her right leg.  She underwent a duplex which I have reviewed.  This showed extensive DVT in her right leg from as far superior as could be seen in her common femoral vein through her femoral vein and popliteal vein and tibial veins.  She was started on Rivaroxaban and discharged home.  She is seen now for further discussion.  Does report that she has been doing a good job of elevating her leg when she is not up walking and reports that the swelling has improved.  She does continue to have some discomfort in her right leg.  Her past history is significant for a similar DVT in her right leg in 2017.  This too was unprovoked.  She was on Coumadin therapy for several months and then this was discontinued.  She does not have any history of recent trauma.  She did have a 5-hour car ride approximately 1 month ago.  She has mild renal insufficiency with a creatinine in the 1.5 range.  Does report that she has had what sounds like hives from CT contrast media years ago.  Pneumonia being admitted in mid November for fever with temp of 102 and this has resolved  Past Medical History:  Diagnosis Date  . DVT (deep vein thrombosis) in pregnancy ([place])   . DVT (deep venous thrombosis) ([place])   . Hypertension   . Renal disorder     Family History  Problem Relation Age of Onset  . Cancer Mother     SOCIAL HISTORY: Social History   Socioeconomic History  . Marital status: Single    Spouse name: Not on file  . Number of children: Not on file  . Years of education: Not on file  . Highest education level: Not on file  Social Needs  . Financial resource strain: Not on  file  . Food insecurity - worry: Not on file  . Food insecurity - inability: Not on file  . Transportation needs - medical: Not on file  . Transportation needs - non-medical: Not on file  Occupational History  . Not on file  Tobacco Use  . Smoking status: Never Smoker  . Smokeless tobacco: Never Used  Substance and Sexual Activity  . Alcohol use: Yes  . Drug use: No  . Sexual activity: Not on file  Other Topics Concern  . Not on file  Social History Narrative  . Not on file    Allergies  Allergen Reactions  . Bee Venom Anaphylaxis  . Contrast Media [Iodinated Diagnostic Agents]     Current Outpatient Medications  Medication Sig Dispense Refill  . amLODipine (NORVASC) 10 MG tablet Take 1 tablet (10 mg total) by mouth daily. 30 tablet 0  . atenolol-chlorthalidone (TENORETIC) 100-25 MG tablet Take 1 tablet by mouth daily.  2  . cholecalciferol (VITAMIN D) 1000 units tablet Take 1,000 Units by mouth daily.    Marland Kitchen gabapentin (NEURONTIN) 300 MG capsule Take 300 mg by mouth 3 (three) times daily.    . Rivaroxaban 15 & 20 MG TBPK Take as directed on package: Start with one 15mg  tablet by mouth twice a day with food. On Day 22, switch  to one 20mg  tablet once a day with food. 51 each 0  . vitamin B-12 (CYANOCOBALAMIN) 1000 MCG tablet Take 1,000 mcg by mouth daily.     No current facility-administered medications for this visit.     REVIEW OF SYSTEMS:  [X]  denotes positive finding, [ ]  denotes negative finding Cardiac  Comments:  Chest pain or chest pressure:    Shortness of breath upon exertion:    Short of breath when lying flat:    Irregular heart rhythm:        Vascular    Pain in calf, thigh, or hip brought on by ambulation:    Pain in feet at night that wakes you up from your sleep:     Blood clot in your veins: x   Leg swelling:  x       Pulmonary    Oxygen at home:    Productive cough:     Wheezing:         Neurologic    Sudden weakness in arms or legs:       Sudden numbness in arms or legs:     Sudden onset of difficulty speaking or slurred speech:    Temporary loss of vision in one eye:     Problems with dizziness:         Gastrointestinal    Blood in stool:     Vomited blood:         Genitourinary    Burning when urinating:     Blood in urine:        Psychiatric    Major depression:         Hematologic    Bleeding problems:    Problems with blood clotting too easily:        Skin    Rashes or ulcers:        Constitutional    Fever or chills:      PHYSICAL EXAM: Vitals:   06/27/17 1121  BP: 128/87  Pulse: 67  Resp: 20  Temp: 97.7 F (36.5 C)  TempSrc: Oral  SpO2: 100%  Weight: 148 lb (67.1 kg)  Height: 5\' 2"  (1.575 m)    GENERAL: The patient is a well-nourished female, in no acute distress. The vital signs are documented above. CARDIOVASCULAR: Palpable radial and dorsalis pedis pulses.  No varicosities bilaterally.  Moderate swelling right leg versus left PULMONARY: There is good air exchange  ABDOMEN: Soft and non-tender  MUSCULOSKELETAL: There are no major deformities or cyanosis. NEUROLOGIC: No focal weakness or paresthesias are detected. SKIN: There are no ulcers or rashes noted. PSYCHIATRIC: The patient has a normal affect.  DATA:  Duplex from 1231 shows extensive DVT from right common femoral distally  MEDICAL ISSUES: Long discussion with patient regarding options.  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  She was appropriately treated with the first DVT with several months of oral anticoagulation with Coumadin.  She has now had another D I did discuss the option of venography and possible vein clot lysis versus mechanical removal with AngioJet.  Feel this would be appropriate since she has had these 2 recurrences.  She will continue on her anticoagulation until her last dose on January 6.  Has been scheduled for venography and possible lysis with Dr. Donzetta Matters on  January 7   Rosetta Posner, MD [facility] Tel [phone] Pager [phone]

## 2017-06-27 NOTE — Progress Notes (Signed)
Per Dr. Luther Parody instructions patient was told to continue Xarelto but do not take on 07/01/2017. Instructions added to letter, and verbalized understanding.

## 2017-06-28 ENCOUNTER — Other Ambulatory Visit: Payer: Self-pay | Admitting: *Deleted

## 2017-07-01 ENCOUNTER — Ambulatory Visit (HOSPITAL_COMMUNITY)
Admission: RE | Disposition: A | Discharge status: Home / Self Care | Payer: Self-pay | Source: Ambulatory Visit | Attending: Vascular Surgery

## 2017-07-01 ENCOUNTER — Observation Stay (HOSPITAL_COMMUNITY)
Admission: RE | Admit: 2017-07-01 | Discharge: 2017-07-02 | Disposition: A | Discharge status: Home / Self Care | Payer: BLUE CROSS/BLUE SHIELD | Source: Ambulatory Visit | Attending: Vascular Surgery | Admitting: Vascular Surgery

## 2017-07-01 DIAGNOSIS — Z7901 Long term (current) use of anticoagulants: Secondary | ICD-10-CM | POA: Diagnosis not present

## 2017-07-01 DIAGNOSIS — I82409 Acute embolism and thrombosis of unspecified deep veins of unspecified lower extremity: Secondary | ICD-10-CM | POA: Diagnosis present

## 2017-07-01 DIAGNOSIS — I82491 Acute embolism and thrombosis of other specified deep vein of right lower extremity: Secondary | ICD-10-CM | POA: Diagnosis not present

## 2017-07-01 DIAGNOSIS — I82431 Acute embolism and thrombosis of right popliteal vein: Secondary | ICD-10-CM | POA: Diagnosis not present

## 2017-07-01 DIAGNOSIS — Z91041 Radiographic dye allergy status: Secondary | ICD-10-CM | POA: Diagnosis not present

## 2017-07-01 DIAGNOSIS — I82411 Acute embolism and thrombosis of right femoral vein: Secondary | ICD-10-CM | POA: Diagnosis not present

## 2017-07-01 DIAGNOSIS — Z86718 Personal history of other venous thrombosis and embolism: Secondary | ICD-10-CM | POA: Diagnosis not present

## 2017-07-01 DIAGNOSIS — N289 Disorder of kidney and ureter, unspecified: Secondary | ICD-10-CM | POA: Insufficient documentation

## 2017-07-01 DIAGNOSIS — Z79899 Other long term (current) drug therapy: Secondary | ICD-10-CM | POA: Insufficient documentation

## 2017-07-01 DIAGNOSIS — I10 Essential (primary) hypertension: Secondary | ICD-10-CM | POA: Insufficient documentation

## 2017-07-01 HISTORY — PX: PERIPHERAL VASCULAR THROMBECTOMY: CATH118306

## 2017-07-01 LAB — POCT I-STAT, CHEM 8
BUN: 13 mg/dL (ref 6–20)
Calcium, Ion: 1.22 mmol/L (ref 1.15–1.40)
Chloride: 108 mmol/L (ref 101–111)
Creatinine, Ser: 1.5 mg/dL — ABNORMAL HIGH (ref 0.44–1.00)
Glucose, Bld: 89 mg/dL (ref 65–99)
HEMATOCRIT: 36 % (ref 36.0–46.0)
HEMOGLOBIN: 12.2 g/dL (ref 12.0–15.0)
Potassium: 4.4 mmol/L (ref 3.5–5.1)
SODIUM: 143 mmol/L (ref 135–145)
TCO2: 26 mmol/L (ref 22–32)

## 2017-07-01 SURGERY — PERIPHERAL VASCULAR THROMBECTOMY
Anesthesia: LOCAL

## 2017-07-01 MED ORDER — METHYLPREDNISOLONE SODIUM SUCC 125 MG IJ SOLR
INTRAMUSCULAR | Status: AC
  Administered 2017-07-01: 125 mg via INTRAVENOUS
  Filled 2017-07-01: qty 2

## 2017-07-01 MED ORDER — DIPHENHYDRAMINE HCL 50 MG/ML IJ SOLN
INTRAMUSCULAR | Status: AC
  Administered 2017-07-01: 25 mg via INTRAVENOUS
  Filled 2017-07-01: qty 1

## 2017-07-01 MED ORDER — SODIUM CHLORIDE 0.9% FLUSH: 3.0000 mL | INTRAVENOUS | Status: DC | PRN

## 2017-07-01 MED ORDER — HYDRALAZINE HCL 20 MG/ML IJ SOLN
5.0000 mg | INTRAMUSCULAR | Status: AC | PRN
  Administered 2017-07-01 (×2): 5 mg via INTRAVENOUS

## 2017-07-01 MED ORDER — AMLODIPINE BESYLATE 5 MG PO TABS
ORAL_TABLET | ORAL | Status: AC
  Filled 2017-07-01: qty 2

## 2017-07-01 MED ORDER — LIDOCAINE HCL (PF) 1 % IJ SOLN
INTRAMUSCULAR | Status: AC
  Filled 2017-07-01: qty 30

## 2017-07-01 MED ORDER — ASPIRIN EC 81 MG PO TBEC
81.0000 mg | DELAYED_RELEASE_TABLET | Freq: Every day | ORAL | Status: DC
  Administered 2017-07-02: 81 mg via ORAL
  Filled 2017-07-01: qty 1

## 2017-07-01 MED ORDER — IODIXANOL 320 MG/ML IV SOLN
INTRAVENOUS | Status: DC | PRN
  Administered 2017-07-01: 35 mL

## 2017-07-01 MED ORDER — LIDOCAINE HCL (PF) 1 % IJ SOLN
INTRAMUSCULAR | Status: DC | PRN
  Administered 2017-07-01: 5 mL via INTRADERMAL

## 2017-07-01 MED ORDER — SODIUM CHLORIDE 0.9 % IV SOLN: 250.0000 mL | INTRAVENOUS | Status: DC | PRN

## 2017-07-01 MED ORDER — FENTANYL CITRATE (PF) 100 MCG/2ML IJ SOLN
INTRAMUSCULAR | Status: AC
  Filled 2017-07-01: qty 2

## 2017-07-01 MED ORDER — FAMOTIDINE IN NACL 20-0.9 MG/50ML-% IV SOLN
INTRAVENOUS | Status: AC
  Administered 2017-07-01: 20 mg via INTRAVENOUS
  Filled 2017-07-01: qty 50

## 2017-07-01 MED ORDER — SODIUM CHLORIDE 0.9% FLUSH: 3.0000 mL | Freq: Two times a day (BID) | INTRAVENOUS | Status: DC

## 2017-07-01 MED ORDER — MIDAZOLAM HCL 2 MG/2ML IJ SOLN
INTRAMUSCULAR | Status: AC
  Filled 2017-07-01: qty 2

## 2017-07-01 MED ORDER — ACETAMINOPHEN 325 MG PO TABS: 650.0000 mg | ORAL_TABLET | ORAL | Status: DC | PRN

## 2017-07-01 MED ORDER — GABAPENTIN 300 MG PO CAPS
300.0000 mg | ORAL_CAPSULE | Freq: Three times a day (TID) | ORAL | Status: DC | PRN
  Administered 2017-07-01: 300 mg via ORAL
  Filled 2017-07-01: qty 1

## 2017-07-01 MED ORDER — SODIUM CHLORIDE 0.9 % IV SOLN
INTRAVENOUS | Status: DC
  Filled 2017-07-01: qty 20

## 2017-07-01 MED ORDER — HEPARIN (PORCINE) IN NACL 2-0.9 UNIT/ML-% IJ SOLN
INTRAMUSCULAR | Status: AC | PRN
  Administered 2017-07-01: 500 mL

## 2017-07-01 MED ORDER — VITAMIN D 1000 UNITS PO TABS
1000.0000 [IU] | ORAL_TABLET | Freq: Every day | ORAL | Status: DC
  Administered 2017-07-02: 1000 [IU] via ORAL
  Filled 2017-07-01: qty 1

## 2017-07-01 MED ORDER — OXYCODONE HCL 5 MG PO TABS: 5.0000 mg | ORAL_TABLET | ORAL | Status: DC | PRN

## 2017-07-01 MED ORDER — ONDANSETRON HCL 4 MG/2ML IJ SOLN: 4.0000 mg | Freq: Four times a day (QID) | INTRAMUSCULAR | Status: DC | PRN

## 2017-07-01 MED ORDER — HEPARIN (PORCINE) IN NACL 100-0.45 UNIT/ML-% IJ SOLN
900.0000 [IU]/h | INTRAMUSCULAR | Status: DC
  Administered 2017-07-02: 900 [IU]/h via INTRAVENOUS
  Filled 2017-07-01: qty 250

## 2017-07-01 MED ORDER — SODIUM CHLORIDE 0.9 % IV SOLN: INTRAVENOUS | Status: AC

## 2017-07-01 MED ORDER — AMLODIPINE BESYLATE 10 MG PO TABS
10.0000 mg | ORAL_TABLET | Freq: Every day | ORAL | Status: DC
  Administered 2017-07-01 – 2017-07-02 (×2): 10 mg via ORAL
  Filled 2017-07-01: qty 1

## 2017-07-01 MED ORDER — HYDRALAZINE HCL 20 MG/ML IJ SOLN
INTRAMUSCULAR | Status: AC
  Filled 2017-07-01: qty 1

## 2017-07-01 MED ORDER — FAMOTIDINE IN NACL 20-0.9 MG/50ML-% IV SOLN
20.0000 mg | INTRAVENOUS | Status: AC
  Administered 2017-07-01: 20 mg via INTRAVENOUS

## 2017-07-01 MED ORDER — MIDAZOLAM HCL 2 MG/2ML IJ SOLN
INTRAMUSCULAR | Status: DC | PRN
  Administered 2017-07-01: 1 mg via INTRAVENOUS

## 2017-07-01 MED ORDER — DIPHENHYDRAMINE HCL 50 MG/ML IJ SOLN
25.0000 mg | INTRAMUSCULAR | Status: AC
  Administered 2017-07-01: 25 mg via INTRAVENOUS

## 2017-07-01 MED ORDER — VITAMIN B-12 1000 MCG PO TABS
1000.0000 ug | ORAL_TABLET | Freq: Every day | ORAL | Status: DC
  Administered 2017-07-02: 1000 ug via ORAL
  Filled 2017-07-01: qty 1

## 2017-07-01 MED ORDER — HEPARIN SODIUM (PORCINE) 1000 UNIT/ML IJ SOLN
INTRAMUSCULAR | Status: AC
  Filled 2017-07-01: qty 1

## 2017-07-01 MED ORDER — METHYLPREDNISOLONE SODIUM SUCC 125 MG IJ SOLR
125.0000 mg | INTRAMUSCULAR | Status: AC
  Administered 2017-07-01: 125 mg via INTRAVENOUS

## 2017-07-01 MED ORDER — HEPARIN SODIUM (PORCINE) 1000 UNIT/ML IJ SOLN
INTRAMUSCULAR | Status: DC | PRN
  Administered 2017-07-01: 7000 [IU] via INTRAVENOUS

## 2017-07-01 MED ORDER — SODIUM CHLORIDE 0.9 % IV BOLUS (SEPSIS): 500.0000 mL | Freq: Once | INTRAVENOUS | Status: DC

## 2017-07-01 MED ORDER — LABETALOL HCL 5 MG/ML IV SOLN: 10.0000 mg | INTRAVENOUS | Status: DC | PRN

## 2017-07-01 MED ORDER — SODIUM CHLORIDE 0.9 % IV SOLN
INTRAVENOUS | Status: DC
  Administered 2017-07-01: 14:00:00 via INTRAVENOUS
  Administered 2017-07-01: 500 mL via INTRAVENOUS

## 2017-07-01 MED ORDER — FENTANYL CITRATE (PF) 100 MCG/2ML IJ SOLN
INTRAMUSCULAR | Status: DC | PRN
  Administered 2017-07-01: 50 ug via INTRAVENOUS

## 2017-07-01 SURGICAL SUPPLY — 14 items
BALLN MUSTANG 10X80X75 (BALLOONS) ×2
BALLOON MUSTANG 10X80X75 (BALLOONS) ×1 IMPLANT
CATH ANGIO 5F BER2 65CM (CATHETERS) ×2 IMPLANT
CATH VISIONS PV .035 IVUS (CATHETERS) ×2 IMPLANT
COVER PRB 48X5XTLSCP FOLD TPE (BAG) ×1 IMPLANT
COVER PROBE 5X48 (BAG) ×1
GUIDEWIRE ANGLED .035X150CM (WIRE) ×2 IMPLANT
KIT ENCORE 26 ADVANTAGE (KITS) ×2 IMPLANT
KIT MICROINTRODUCER STIFF 5F (SHEATH) ×2 IMPLANT
SET ZELANTE DVT THROMB (CATHETERS) ×2 IMPLANT
SHEATH PINNACLE 9F 10CM (SHEATH) ×2 IMPLANT
TRAY PV CATH (CUSTOM PROCEDURE TRAY) ×2 IMPLANT
WIRE AMPLATZ SS-J .035X260CM (WIRE) ×2 IMPLANT
WIRE BENTSON .035X145CM (WIRE) ×2 IMPLANT

## 2017-07-01 NOTE — Progress Notes (Signed)
ANTICOAGULATION CONSULT NOTE - Initial Consult  Pharmacy Consult:  Heparin Indication:  Recurrent DVTs  Allergies  Allergen Reactions  . Bee Venom Anaphylaxis  . Contrast Media [Iodinated Diagnostic Agents] Itching and Swelling    Patient Measurements: Height: 5\' 2"  (157.5 cm) Weight: 148 lb (67.1 kg) IBW/kg (Calculated) : 50.1 Heparin Dosing Weight: 64 kg  Vital Signs: Temp: 98.2 F (36.8 C) (01/07 1839) Temp Source: Oral (01/07 1839) BP: 182/96 (01/07 1839) Pulse Rate: 60 (01/07 1750)  Labs: Recent Labs    07/01/17 1115  HGB 12.2  HCT 36.0  CREATININE 1.50*    Estimated Creatinine Clearance: 34.5 mL/min (A) (by C-G formula based on SCr of 1.5 mg/dL (H)).   Medical History: Past Medical History:  Diagnosis Date  . DVT (deep vein thrombosis) in pregnancy ([place])   . DVT (deep venous thrombosis) ([place])   . Hypertension   . Renal disorder       Assessment: 46 YOF on Xarelto PTA for history of DVT (last dose 1/6 at 1800).  Patient underwent femoral pharmacomechanical thrombectomy today 07/01/17 for extensive DVT and Pharmacy consulted to initiate IV heparin 8 hours post sheath removal.  Sheath removed around 1720 per RN and no bleeding/hematoma reported.   Goal of Therapy:  Heparin level 0.3-0.7 units/ml Monitor platelets by anticoagulation protocol: Yes    Plan:  On 07/02/17 at 0130, start heparin gtt at 900 units/hr, no bolus Check 6 hr heparin level and aPTT Daily heparin level, aPTT and CBC Monitor for s/sx of bleeding/hematoma   Nicco Reaume D. Mina Marble, PharmD, BCPS Pager:  [phone] 07/01/2017, 7:14 PM

## 2017-07-01 NOTE — Progress Notes (Signed)
IV looks to have infiltrated. Dr. Donzetta Matters paged and made aware. IV team paged, consult requested

## 2017-07-01 NOTE — Progress Notes (Signed)
Patient arrived from the cath lab. Incision site area, rt. Popliteal is clean dry and intact. Dressing is a gauze with tegaderm. Patient VS B/P182/96, HR 73, Resp 16, O2 100%. Patient is Alert and Oriented. Patient denies any pain.

## 2017-07-01 NOTE — H&P (Signed)
   History and Physical Update  The patient was interviewed and re-examined.  The patient's previous History and Physical has been reviewed and is unchanged from recent office visit. Plan for right leg ascending venogram, ivus, possible pharmacomechanical thrombectomy possible lysis.    Ossie Beltran C. Donzetta Matters, MD [facility] [place] Office: [phone] Pager: [phone]   07/01/2017, 10:19 AM

## 2017-07-01 NOTE — Progress Notes (Signed)
IV bolus resumed rt ac--NS at 250cc for 2 hours

## 2017-07-01 NOTE — Progress Notes (Signed)
Site area: rt popliteal venous sheath  Site Prior to Removal:  Level 0 Pressure Applied For:  20 minutes Manual:   yes Patient Status During Pull:  stable Post Pull Site:  Level  0 Post Pull Instructions Given:  yes Post Pull Pulses Present: rt dp dopplered Dressing Applied:  Gauze and tegaderm Bedrest begins @ 8333 Comments:

## 2017-07-01 NOTE — Op Note (Signed)
    Patient name: Rhonda Mcneil MRN: [id number] DOB: 1955/11/06 Sex: female  07/01/2017 Pre-operative Diagnosis: extensive right lower extremity dvt Post-operative diagnosis:  Same Surgeon:  Eda Paschal. Donzetta Matters, MD Procedure Performed: 1.  US guided cannulation of right popliteal vein 2.  Right lower extremity venogram 3.  IVUS of right femoral, common femoral, external iliac, common iliac veins and IVC 4.  Angiojet pharmacomechanical thrombectomy for 196 seconds 5.  Balloon angioplasty of right femoral and common femoral veins with 18m balloon 6.  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  She is indicated for angiogram possible intervention.  Findings: The popliteal and femoral veins have occlusive DVT with both chronic and acute component.  The common femoral vein has minimal chronic thrombus with acute but is nonocclusive.  The external and common iliac veins as well as IVC are free of disease.  Following intervention the common femoral vein measures 11.6 mm in diameter.  There is a lumen throughout the entire femoral vein although there is one area there is residual tight stenosis greater than 50%.  I elected to not intervene any further given that she now had a lumen and flow channel.   Procedure:  The patient was identified in the holding area and taken to room 8.  The patient was then placed prone on the table given moderate sedation.  We used ultrasound to cannulate the popliteal vein on the right with micropuncture and placed a micropuncture sheath followed by a Bentson wire.  This time she was given 7000 units of heparin.  We then exchanged for a 8 French sheath.  We performed obvious of the entire right lower extremity with the above findings.  We then performed AngioJet for a total of a 51 seconds and then placed TPA for a total  of 70 seconds.  After waiting 10 minutes we returned performed AngioJet again up to a total of 196 seconds and then performed balloon angioplasty with 10 mm balloon.  I was then demonstrated residual stenosis that was tight in the cephalad aspect of the femoral vein.  We again performed balloon angioplasty this time up to a higher pressure 12 atm.  We performed office again which demonstrated some residual chronic thrombosis but there was a flow channel throughout and 11.6 mm common femoral vein.  We also performed venography demonstrating similar results.  Satisfied with this we then removed our wire sheath was pulled pressure held for 10 minutes.  Patient tolerated procedure well without immediate complication.   Contrast: 35cc  Vittorio Mohs C. CDonzetta Matters MD [facility]: [phone]: [phone]

## 2017-07-02 ENCOUNTER — Ambulatory Visit (HOSPITAL_COMMUNITY): Admission: RE | Admit: 2017-07-02 | Payer: BLUE CROSS/BLUE SHIELD | Source: Ambulatory Visit | Admitting: Surgery

## 2017-07-02 ENCOUNTER — Encounter (HOSPITAL_COMMUNITY): Payer: Self-pay | Admitting: *Deleted

## 2017-07-02 ENCOUNTER — Encounter (HOSPITAL_COMMUNITY): Admission: RE | Payer: Self-pay | Source: Ambulatory Visit

## 2017-07-02 DIAGNOSIS — I82411 Acute embolism and thrombosis of right femoral vein: Secondary | ICD-10-CM | POA: Diagnosis not present

## 2017-07-02 LAB — BASIC METABOLIC PANEL
Anion gap: 10 (ref 5–15)
BUN: 15 mg/dL (ref 6–20)
CO2: 22 mmol/L (ref 22–32)
CREATININE: 1.4 mg/dL — AB (ref 0.44–1.00)
Calcium: 9.4 mg/dL (ref 8.9–10.3)
Chloride: 107 mmol/L (ref 101–111)
GFR, EST AFRICAN AMERICAN: 45 mL/min — AB (ref >60)
GFR, EST NON AFRICAN AMERICAN: 39 mL/min — AB (ref >60)
Glucose, Bld: 161 mg/dL — ABNORMAL HIGH (ref 65–99)
Potassium: 4 mmol/L (ref 3.5–5.1)
SODIUM: 139 mmol/L (ref 135–145)

## 2017-07-02 LAB — POCT ACTIVATED CLOTTING TIME: ACTIVATED CLOTTING TIME: 219 s

## 2017-07-02 SURGERY — PERIPHERAL VASCULAR THROMBECTOMY
Anesthesia: LOCAL

## 2017-07-02 MED ORDER — RIVAROXABAN 15 MG PO TABS
15.0000 mg | ORAL_TABLET | Freq: Two times a day (BID) | ORAL | Status: DC
  Administered 2017-07-02: 15 mg via ORAL
  Filled 2017-07-02: qty 1

## 2017-07-02 NOTE — Progress Notes (Signed)
  Progress Note    07/02/2017 9:58 AM 1 Day Post-Op  Subjective:  No complaints today  Vitals:   07/01/17 2038 07/02/17 0450  BP: (!) 164/88 137/79  Pulse: 84 70  Resp: 19 (!) 22  Temp: 98.5 F (36.9 C) 98.2 F (36.8 C)  SpO2: 99% 95%    Physical Exam: aaox3 Non labored respirations Right leg is soft, no hematoma   CBC    Component Value Date/Time   WBC 9.7 06/24/2017 2213   RBC 3.91 06/24/2017 2213   HGB 12.2 07/01/2017 1115   HCT 36.0 07/01/2017 1115   PLT 371 06/24/2017 2213   MCV 83.4 06/24/2017 2213   MCH 27.6 06/24/2017 2213   MCHC 33.1 06/24/2017 2213   RDW 15.9 (H) 06/24/2017 2213   LYMPHSABS 3.7 06/24/2017 2213   MONOABS 0.7 06/24/2017 2213   EOSABS 0.4 06/24/2017 2213   BASOSABS 0.0 06/24/2017 2213    BMET    Component Value Date/Time   NA 143 07/01/2017 1115   K 4.4 07/01/2017 1115   CL 108 07/01/2017 1115   CO2 25 06/24/2017 2213   GLUCOSE 89 07/01/2017 1115   BUN 13 07/01/2017 1115   CREATININE 1.50 (H) 07/01/2017 1115   CALCIUM 9.0 06/24/2017 2213   GFRNONAA 37 (L) 06/24/2017 2213   GFRAA 43 (L) 06/24/2017 2213    INR No results found for: INR   Intake/Output Summary (Last 24 hours) at 07/02/2017 0958 Last data filed at 07/02/2017 0900 Gross per 24 hour  Intake 520.8 ml  Output 1200 ml  Net -679.2 ml     Assessment:  62 y.o. female is s/p thrombolysis and pts of right femoral and common femoral veins  Plan: Check bmp this a.m. Ted hose, discussed outpatient compression hose Transition to xarelto, will need for foreseeable future given recurrent dvt without central obstructive component F/u in 4 weeks with rle venous duplex when discharged   [place]. Donzetta Matters, MD [facility] [place] Office: [phone] Pager: [phone]  07/02/2017 9:58 AM

## 2017-07-02 NOTE — Progress Notes (Signed)
ANTICOAGULATION CONSULT NOTE - Follow Up Consult  Pharmacy Consult:  Heparin Indication:  Recurrent DVTs  Allergies  Allergen Reactions  . Bee Venom Anaphylaxis  . Contrast Media [Iodinated Diagnostic Agents] Itching and Swelling    Patient Measurements: Height: 5\' 2"  (157.5 cm) Weight: 148 lb (67.1 kg) IBW/kg (Calculated) : 50.1 Heparin Dosing Weight: 64 kg  Vital Signs: Temp: 98.2 F (36.8 C) (01/08 0450) Temp Source: Oral (01/08 0450) BP: 137/79 (01/08 0450) Pulse Rate: 70 (01/08 0450)  Labs: Recent Labs    07/01/17 1115  HGB 12.2  HCT 36.0  CREATININE 1.50*    Estimated Creatinine Clearance: 34.5 mL/min (A) (by C-G formula based on SCr of 1.5 mg/dL (H)).   Medical History: Past Medical History:  Diagnosis Date  . DVT (deep vein thrombosis) in pregnancy ([place])   . DVT (deep venous thrombosis) ([place])   . Hypertension   . Renal disorder       Assessment: 77 YOF on Xarelto PTA for history of DVT (last dose 1/6 at 1800).  Patient underwent femoral pharmacomechanical thrombectomy today 07/01/17 for extensive DVT and pharmacy consulted to initiate IV heparin 8 hours post sheath removal.  Sheath removed around 1720 per RN and no bleeding/hematoma reported.  Plan today to transition back to Xarelto. Discussed with patient, who started on Xarelto on 12/31 with last dose being on 1/6 at 1800. No signs/symptoms of bleeding. CBC stable. CrCl using TBW is ~41 mL/min.   Plan:  Discontinue heparin infusion Start Xarelto 15 mg twice daily for 14 more days, then start 20 mg daily  Monitor CBC, renal function, and for s/sx of bleeding  Doylene Canard, PharmD Clinical Pharmacist  Pager: [phone] Clinical Phone for 07/02/2017 until 3:30pm: [phone] If after 3:30pm, please call main pharmacy at [phone] 07/02/2017, 8:36 AM

## 2017-07-02 NOTE — Discharge Instructions (Signed)
Information on my medicine - XARELTO (rivaroxaban)  This medication education was reviewed with me or my healthcare representative as part of my discharge preparation.  The pharmacist that spoke with me during my hospital stay was:  Betsey Holiday, [place]? Xarelto was prescribed to treat blood clots that may have been found in the veins of your legs (deep vein thrombosis) or in your lungs (pulmonary embolism) and to reduce the risk of them occurring again.  What do you need to know about Xarelto? The starting dose is one 15 mg tablet taken TWICE daily with food for the FIRST 21 DAYS then the dose is changed to one 20 mg tablet taken ONCE A DAY with your evening meal.  DO NOT stop taking Xarelto without talking to the health care provider who prescribed the medication.  Refill your prescription for 20 mg tablets before you run out.  After discharge, you should have regular check-up appointments with your healthcare provider that is prescribing your Xarelto.  In the future your dose may need to be changed if your kidney function changes by a significant amount.  What do you do if you miss a dose? If you are taking Xarelto TWICE DAILY and you miss a dose, take it as soon as you remember. You may take two 15 mg tablets (total 30 mg) at the same time then resume your regularly scheduled 15 mg twice daily the next day.  If you are taking Xarelto ONCE DAILY and you miss a dose, take it as soon as you remember on the same day then continue your regularly scheduled once daily regimen the next day. Do not take two doses of Xarelto at the same time.   Important Safety Information Xarelto is a blood thinner medicine that can cause bleeding. You should call your healthcare provider right away if you experience any of the following: ? Bleeding from an injury or your nose that does not stop. ? Unusual colored urine (red or dark [place]) or unusual colored stools  (red or black). ? Unusual bruising for unknown reasons. ? A serious fall or if you hit your head (even if there is no bleeding).  Some medicines may interact with Xarelto and might increase your risk of bleeding while on Xarelto. To help avoid this, consult your healthcare provider or pharmacist prior to using any new prescription or non-prescription medications, including herbals, vitamins, non-steroidal anti-inflammatory drugs (NSAIDs) and supplements.  This website has more information on Xarelto: [email or web address]

## 2017-07-02 NOTE — Discharge Summary (Signed)
Discharge Summary    Rhonda Mcneil 31-Oct-1955 62 y.o. female  [id number]  Admission Date: 07/01/2017  Discharge Date: 07/02/17  Physician: Thomes Lolling*  Admission Diagnosis: DVT (deep venous thrombosis) ([place]) [I82.409]   HPI:   This is a 62 y.o. female who is seen for evaluation of right leg DVT.  She presented to the hospital on 06/24/2017 with pain and swelling in her right leg.  She underwent a duplex which I have reviewed.  This showed extensive DVT in her right leg from as far superior as could be seen in her common femoral vein through her femoral vein and popliteal vein and tibial veins.  She was started on Rivaroxaban and discharged home.  She is seen now for further discussion.  Does report that she has been doing a good job of elevating her leg when she is not up walking and reports that the swelling has improved.  She does continue to have some discomfort in her right leg.  Her past history is significant for a similar DVT in her right leg in 2017.  This too was unprovoked.  She was on Coumadin therapy for several months and then this was discontinued.  She does not have any history of recent trauma.  She did have a 5-hour car ride approximately 1 month ago.  She has mild renal insufficiency with a creatinine in the 1.5 range.  Does report that she has had what sounds like hives from CT contrast media years ago.  Pneumonia being admitted in mid November for fever with temp of 102 and this has resolved   Hospital Course:  The patient was admitted to the hospital and taken to the operating room on 07/01/2017 and underwent: Procedure Performed: 1.  US guided cannulation of right popliteal vein 2.  Right lower extremity venogram 3.  IVUS of right femoral, common femoral, external iliac, common iliac veins and IVC 4.  Angiojet pharmacomechanical thrombectomy for 196 seconds 5.  Balloon angioplasty of right femoral and common femoral veins with 72mm balloon 6.  Moderate  sedation with fentanyl and versed for 67 minutes   Findings: The popliteal and femoral veins have occlusive DVT with both chronic and acute component.  The common femoral vein has minimal chronic thrombus with acute but is nonocclusive.  The external and common iliac veins as well as IVC are free of disease.  Following intervention the common femoral vein measures 11.6 mm in diameter.  There is a lumen throughout the entire femoral vein although there is one area there is residual tight stenosis greater than 50%.  I elected to not intervene any further given that she now had a lumen and flow channel.   The pt tolerated the procedure well and was transported to the PACU in good condition.   By POD 1, her creatinine was at baseline and her heparin was discontinued and Xarelto restarted.  She is instructed on mild compression stockings, ambulation and leg elevation.   The remainder of the hospital course consisted of increasing mobilization and increasing intake of solids without difficulty.  CBC    Component Value Date/Time   WBC 9.7 06/24/2017 2213   RBC 3.91 06/24/2017 2213   HGB 12.2 07/01/2017 1115   HCT 36.0 07/01/2017 1115   PLT 371 06/24/2017 2213   MCV 83.4 06/24/2017 2213   MCH 27.6 06/24/2017 2213   MCHC 33.1 06/24/2017 2213   RDW 15.9 (H) 06/24/2017 2213   LYMPHSABS 3.7 06/24/2017 2213   MONOABS 0.7 06/24/2017  2213   EOSABS 0.4 06/24/2017 2213   BASOSABS 0.0 06/24/2017 2213    BMET    Component Value Date/Time   NA 139 07/02/2017 0801   K 4.0 07/02/2017 0801   CL 107 07/02/2017 0801   CO2 22 07/02/2017 0801   GLUCOSE 161 (H) 07/02/2017 0801   BUN 15 07/02/2017 0801   CREATININE 1.40 (H) 07/02/2017 0801   CALCIUM 9.4 07/02/2017 0801   GFRNONAA 39 (L) 07/02/2017 0801   GFRAA 45 (L) 07/02/2017 0801      Discharge Instructions    Discharge instructions   Complete by:  As directed    Wear mild compression stockings daily.  Walk daily.  When not walking, keep legs  elevated.      Discharge Diagnosis:  DVT (deep venous thrombosis) ([facility]) [I82.409]  Secondary Diagnosis: Patient Active Problem List   Diagnosis Date Noted  . DVT (deep venous thrombosis) ([place]) 07/01/2017  . Pneumonia 05/19/2017   Past Medical History:  Diagnosis Date  . DVT (deep vein thrombosis) in pregnancy ([place])   . DVT (deep venous thrombosis) ([place])   . Hypertension   . Renal disorder      Allergies as of 07/02/2017      Reactions   Bee Venom Anaphylaxis   Contrast Media [iodinated Diagnostic Agents] Itching, Swelling      Medication List    TAKE these medications   amLODipine 10 MG tablet Commonly known as:  NORVASC Take 1 tablet (10 mg total) by mouth daily.   atenolol-chlorthalidone 100-25 MG tablet Commonly known as:  TENORETIC Take 1 tablet by mouth daily.   cholecalciferol 1000 units tablet Commonly known as:  VITAMIN D Take 1,000 Units by mouth daily.   gabapentin 300 MG capsule Commonly known as:  NEURONTIN Take 300 mg by mouth 3 (three) times daily as needed (pain).   Rivaroxaban 15 & 20 MG Tbpk Take as directed on package: Start with one 15mg  tablet by mouth twice a day with food. On Day 22, switch to one 20mg  tablet once a day with food.   vitamin B-12 1000 MCG tablet Commonly known as:  CYANOCOBALAMIN Take 1,000 mcg by mouth daily.       Prescriptions given: none  Instructions:  Wear mild compression stockings daily.  Walk daily.  When not walking, keep legs elevated.    Information on my medicine - XARELTO (rivaroxaban)  This medication education was reviewed with me or my healthcare representative as part of my discharge preparation.  The pharmacist that spoke with me during my hospital stay was:  Betsey Holiday, [place]? Xarelto was prescribed to treat blood clots that may have been found in the veins of your legs (deep vein thrombosis) or in your lungs (pulmonary embolism) and to reduce the  risk of them occurring again.  WHAT DO YOU NEED TO KNOW ABOUT XARELTO? The starting dose is one 15 mg tablet taken TWICE daily with food for the FIRST 21 DAYS then the dose is changed to one 20 mg tablet taken ONCE A DAY with your evening meal.  DO NOT stop taking Xarelto without talking to the health care provider who prescribed the medication.  Refill your prescription for 20 mg tablets before you run out.  After discharge, you should have regular check-up appointments with your healthcare provider that is prescribing your Xarelto.  In the future your dose may need to be changed if your kidney function changes by a significant  amount.  WHAT DO YOU DO IF YOU MISS A DOSE? If you are taking Xarelto TWICE DAILY and you miss a dose, take it as soon as you remember. You may take two 15 mg tablets (total 30 mg) at the same time then resume your regularly scheduled 15 mg twice daily the next day.  If you are taking Xarelto ONCE DAILY and you miss a dose, take it as soon as you remember on the same day then continue your regularly scheduled once daily regimen the next day. Do not take two doses of Xarelto at the same time.   IMPORTANT SAFETY INFORMATION Xarelto is a blood thinner medicine that can cause bleeding. You should call your healthcare provider right away if you experience any of the following:  Bleeding from an injury or your nose that does not stop.  Unusual colored urine (red or dark [place]) or unusual colored stools (red or black).  Unusual bruising for unknown reasons.  A serious fall or if you hit your head (even if there is no bleeding).  Some medicines may interact with Xarelto and might increase your risk of bleeding while on Xarelto. To help avoid this, consult your healthcare provider or pharmacist prior to using any new prescription or non-prescription medications, including herbals, vitamins, non-steroidal anti-inflammatory drugs (NSAIDs) and  supplements.  This website has more information on Xarelto: [email or web address]-[email or web address].[email or web address]/.  Disposition: home  Patient's condition: is Good  Follow up: 1. Dr. Donzetta Matters in 4 weeks with RLE venous duplex   Leontine Locket, PA-C [facility] [phone] 07/02/2017  10:20 AM

## 2017-07-03 ENCOUNTER — Telehealth: Payer: Self-pay | Admitting: Vascular Surgery

## 2017-07-03 MED FILL — Alteplase For Inj 2 MG: INTRAMUSCULAR | Qty: 20 | Status: AC

## 2017-07-03 NOTE — Telephone Encounter (Signed)
Sched lab 08/01/17 at 4:00 and MD 08/02/17 at 11:30. Lm on hm#.

## 2017-07-03 NOTE — Telephone Encounter (Signed)
-----   Message from Mena Goes, RN sent at 07/02/2017  9:13 AM EST ----- Regarding: 4 weeks w/ lab    ----- Message ----- From: Gabriel Earing, PA-C Sent: 07/02/2017   7:45 AM To: [facility] Charge Pool  S/p DVT angiojet.  F/u with Dr. Donzetta Matters in 4 weeks with RLE venous duplex.  Thanks

## 2017-07-05 ENCOUNTER — Other Ambulatory Visit: Payer: Self-pay

## 2017-07-05 DIAGNOSIS — I824Z9 Acute embolism and thrombosis of unspecified deep veins of unspecified distal lower extremity: Secondary | ICD-10-CM

## 2017-07-10 ENCOUNTER — Encounter (HOSPITAL_COMMUNITY): Payer: Self-pay | Admitting: Vascular Surgery

## 2017-08-01 ENCOUNTER — Ambulatory Visit (HOSPITAL_COMMUNITY)
Admission: RE | Admit: 2017-08-01 | Discharge: 2017-08-01 | Disposition: A | Discharge status: Home / Self Care | Payer: BLUE CROSS/BLUE SHIELD | Source: Ambulatory Visit | Attending: Vascular Surgery | Admitting: Vascular Surgery

## 2017-08-01 DIAGNOSIS — M79661 Pain in right lower leg: Secondary | ICD-10-CM | POA: Insufficient documentation

## 2017-08-01 DIAGNOSIS — M7989 Other specified soft tissue disorders: Secondary | ICD-10-CM | POA: Insufficient documentation

## 2017-08-01 DIAGNOSIS — I824Z9 Acute embolism and thrombosis of unspecified deep veins of unspecified distal lower extremity: Secondary | ICD-10-CM

## 2017-08-02 ENCOUNTER — Other Ambulatory Visit: Payer: Self-pay

## 2017-08-02 ENCOUNTER — Ambulatory Visit (INDEPENDENT_AMBULATORY_CARE_PROVIDER_SITE_OTHER): Payer: No Typology Code available for payment source | Admitting: Vascular Surgery

## 2017-08-02 ENCOUNTER — Encounter: Payer: Self-pay | Admitting: Vascular Surgery

## 2017-08-02 VITALS — BP 139/85 | HR 59 | Temp 97.5°F | Resp 16 | Ht 62.0 in | Wt 161.0 lb

## 2017-08-02 DIAGNOSIS — I82411 Acute embolism and thrombosis of right femoral vein: Secondary | ICD-10-CM

## 2017-08-02 NOTE — Progress Notes (Signed)
Patient ID: Karmen Altamirano, female   DOB: June 18, 1956, 62 y.o.   MRN: [id number]  Reason for Consult: Follow-up (4 wk f/u R DVT, S/p DVT Angiojet. )   Referred by Trey Sailors, PA  Subjective:     HPI:  Wilhelmina Hark is a 62 y.o. female status post intervention for recurrent right lower extremity DVT.  She states that much of her symptoms have resolved.  She does have some residual swelling.  Most of her discomfort is actually in her foot where she will need surgery for a bone spur.  She is walking she does not have any wounds at this time.  She has minimal varicose veins.  She does not have any skin discoloration or itching.  She is religious with her compression stockings and she is taking Xarelto as well.  She is able to walk without much limitation.  Past Medical History:  Diagnosis Date  . DVT (deep vein thrombosis) in pregnancy ([place])   . DVT (deep venous thrombosis) ([place])   . Hypertension   . Renal disorder    Family History  Problem Relation Age of Onset  . Cancer Mother    Past Surgical History:  Procedure Laterality Date  . CARPAL TUNNEL RELEASE    . PERIPHERAL VASCULAR THROMBECTOMY N/A 07/01/2017   Procedure: PERIPHERAL VASCULAR THROMBECTOMY;  Surgeon: Waynetta Sandy, MD;  Location: [facility];  Service: Cardiovascular;  Laterality: N/A;    Short Social History:  Social History   Tobacco Use  . Smoking status: Never Smoker  . Smokeless tobacco: Never Used  Substance Use Topics  . Alcohol use: Yes    Allergies  Allergen Reactions  . Bee Venom Anaphylaxis  . Contrast Media [Iodinated Diagnostic Agents] Itching and Swelling    Current Outpatient Medications  Medication Sig Dispense Refill  . amLODipine (NORVASC) 10 MG tablet Take 1 tablet (10 mg total) by mouth daily. 30 tablet 0  . atenolol-chlorthalidone (TENORETIC) 100-25 MG tablet Take 1 tablet by mouth daily.  2  . cholecalciferol (VITAMIN D) 1000 units tablet Take 1,000 Units by mouth  daily.    Marland Kitchen gabapentin (NEURONTIN) 300 MG capsule Take 300 mg by mouth 3 (three) times daily as needed (pain).     . Rivaroxaban 15 & 20 MG TBPK Take as directed on package: Start with one 15mg  tablet by mouth twice a day with food. On Day 22, switch to one 20mg  tablet once a day with food. 51 each 0  . vitamin B-12 (CYANOCOBALAMIN) 1000 MCG tablet Take 1,000 mcg by mouth daily.     No current facility-administered medications for this visit.     Review of Systems  Constitutional:  Constitutional negative. HENT: HENT negative.  Eyes: Eyes negative.  Respiratory: Respiratory negative.  Cardiovascular: Positive for leg swelling.  GI: Gastrointestinal negative.  Musculoskeletal: Positive for joint pain.  Neurological: Neurological negative. Hematologic: Hematologic/lymphatic negative.  Psychiatric: Psychiatric negative.        Objective:  Objective   Vitals:   08/02/17 1148  BP: 139/85  Pulse: (!) 59  Resp: 16  Temp: (!) 97.5 F (36.4 C)  TempSrc: Oral  SpO2: 100%  Weight: 161 lb (73 kg)  Height: 5\' 2"  (1.575 m)   Body mass index is 29.45 kg/m.  Physical Exam  Constitutional: She appears well-developed.  HENT:  Head: Normocephalic.  Neck: Normal range of motion.  Cardiovascular: Normal rate.  Pulmonary/Chest: Effort normal.  Abdominal: Soft. She exhibits no mass.  Musculoskeletal: She  exhibits edema.  Right leg 36cm, left 35 cm (measured 10cm distal to tuberosity)  Skin: Skin is warm and dry.  Psychiatric: She has a normal mood and affect. Her behavior is normal. Judgment and thought content normal.    Data: I reviewed her duplex which demonstrates full compressibility in her common femoral vein with chronic clot from her femoral vein down to the tibial veins.  There is reflux in the popliteal vein greater than 2 seconds.  These findings are consistent with chronic long-standing obstructive process.     Assessment/Plan:     62 year old female status post right  lower extremity venous intervention.  She has essentially no post-thrombotic syndrome at this time with only minimal residual swelling given C3 venous disease and some varicosities.  We will get her set up for a reflux study in 6 months and she will continue compression stockings and Xarelto indefinitely given her multiple DVTs with no central obstructive component.  Prior to having elective surgery would like her to be on at least 3 months of anticoagulation post procedure.  If she can be on aspirin during the time of procedure that would also be helpful.  Xarelto should be resumed as soon as safe after surgery and she should continue compression stockings afterwards as tolerable as well.  There are any issues I can see her sooner otherwise 6 months will be her follow-up.      Waynetta Sandy MD [facility]

## 2017-08-05 ENCOUNTER — Other Ambulatory Visit: Payer: Self-pay

## 2017-08-05 DIAGNOSIS — I824Z9 Acute embolism and thrombosis of unspecified deep veins of unspecified distal lower extremity: Secondary | ICD-10-CM

## 2017-09-01 ENCOUNTER — Encounter (HOSPITAL_COMMUNITY): Payer: Self-pay

## 2017-09-01 ENCOUNTER — Emergency Department (HOSPITAL_COMMUNITY)
Admission: EM | Admit: 2017-09-01 | Discharge: 2017-09-02 | Disposition: A | Discharge status: Home / Self Care | Payer: BLUE CROSS/BLUE SHIELD | Attending: Emergency Medicine | Admitting: Emergency Medicine

## 2017-09-01 DIAGNOSIS — R1110 Vomiting, unspecified: Secondary | ICD-10-CM | POA: Insufficient documentation

## 2017-09-01 DIAGNOSIS — Y939 Activity, unspecified: Secondary | ICD-10-CM | POA: Diagnosis not present

## 2017-09-01 DIAGNOSIS — Z7901 Long term (current) use of anticoagulants: Secondary | ICD-10-CM | POA: Diagnosis not present

## 2017-09-01 DIAGNOSIS — I10 Essential (primary) hypertension: Secondary | ICD-10-CM | POA: Insufficient documentation

## 2017-09-01 DIAGNOSIS — Z86718 Personal history of other venous thrombosis and embolism: Secondary | ICD-10-CM | POA: Insufficient documentation

## 2017-09-01 DIAGNOSIS — S43401A Unspecified sprain of right shoulder joint, initial encounter: Secondary | ICD-10-CM | POA: Diagnosis not present

## 2017-09-01 DIAGNOSIS — Y999 Unspecified external cause status: Secondary | ICD-10-CM | POA: Diagnosis not present

## 2017-09-01 DIAGNOSIS — Z79899 Other long term (current) drug therapy: Secondary | ICD-10-CM | POA: Diagnosis not present

## 2017-09-01 DIAGNOSIS — Y929 Unspecified place or not applicable: Secondary | ICD-10-CM | POA: Diagnosis not present

## 2017-09-01 DIAGNOSIS — Z041 Encounter for examination and observation following transport accident: Secondary | ICD-10-CM | POA: Diagnosis present
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
## 2017-09-02 ENCOUNTER — Other Ambulatory Visit: Payer: Self-pay

## 2017-09-02 ENCOUNTER — Emergency Department (HOSPITAL_COMMUNITY): Payer: BLUE CROSS/BLUE SHIELD

## 2017-09-02 DIAGNOSIS — S43401A Unspecified sprain of right shoulder joint, initial encounter: Secondary | ICD-10-CM | POA: Diagnosis not present

## 2017-09-02 LAB — CBC WITH DIFFERENTIAL/PLATELET
Basophils Absolute: 0 10*3/uL (ref 0.0–0.1)
Basophils Relative: 0 %
Eosinophils Absolute: 0.1 10*3/uL (ref 0.0–0.7)
Eosinophils Relative: 1 %
HEMATOCRIT: 37.5 % (ref 36.0–46.0)
HEMOGLOBIN: 12 g/dL (ref 12.0–15.0)
LYMPHS ABS: 2.4 10*3/uL (ref 0.7–4.0)
LYMPHS PCT: 21 %
MCH: 27.6 pg (ref 26.0–34.0)
MCHC: 32 g/dL (ref 30.0–36.0)
MCV: 86.4 fL (ref 78.0–100.0)
Monocytes Absolute: 0.5 10*3/uL (ref 0.1–1.0)
Monocytes Relative: 5 %
NEUTROS ABS: 8.5 10*3/uL — AB (ref 1.7–7.7)
NEUTROS PCT: 73 %
Platelets: 288 10*3/uL (ref 150–400)
RBC: 4.34 MIL/uL (ref 3.87–5.11)
RDW: 15.6 % — ABNORMAL HIGH (ref 11.5–15.5)
WBC: 11.5 10*3/uL — AB (ref 4.0–10.5)

## 2017-09-02 LAB — COMPREHENSIVE METABOLIC PANEL
ALK PHOS: 81 U/L (ref 38–126)
ALT: 13 U/L — AB (ref 14–54)
AST: 22 U/L (ref 15–41)
Albumin: 3.5 g/dL (ref 3.5–5.0)
Anion gap: 8 (ref 5–15)
BILIRUBIN TOTAL: 0.4 mg/dL (ref 0.3–1.2)
BUN: 17 mg/dL (ref 6–20)
CALCIUM: 9.4 mg/dL (ref 8.9–10.3)
CO2: 24 mmol/L (ref 22–32)
CREATININE: 1.49 mg/dL — AB (ref 0.44–1.00)
Chloride: 110 mmol/L (ref 101–111)
GFR, EST AFRICAN AMERICAN: 42 mL/min — AB (ref >60)
GFR, EST NON AFRICAN AMERICAN: 36 mL/min — AB (ref >60)
Glucose, Bld: 140 mg/dL — ABNORMAL HIGH (ref 65–99)
Potassium: 4.1 mmol/L (ref 3.5–5.1)
SODIUM: 142 mmol/L (ref 135–145)
Total Protein: 6.5 g/dL (ref 6.5–8.1)

## 2017-09-02 MED ORDER — HYDROCODONE-ACETAMINOPHEN 5-325 MG PO TABS: ORAL_TABLET | ORAL | 0 refills | Status: DC

## 2017-09-02 MED ORDER — MORPHINE SULFATE (PF) 4 MG/ML IV SOLN: 4.0000 mg | Freq: Once | INTRAVENOUS | Status: DC

## 2017-09-02 MED ORDER — HYDROCORTISONE NA SUCCINATE PF 100 MG IJ SOLR: 200.0000 mg | Freq: Once | INTRAMUSCULAR | Status: DC

## 2017-09-02 MED ORDER — ONDANSETRON 4 MG PO TBDP
4.0000 mg | ORAL_TABLET | Freq: Once | ORAL | Status: AC
Start: 2017-09-02 — End: 2017-09-02
  Administered 2017-09-02: 4 mg via ORAL
  Filled 2017-09-02: qty 1

## 2017-09-02 MED ORDER — OXYCODONE-ACETAMINOPHEN 5-325 MG PO TABS
2.0000 | ORAL_TABLET | Freq: Once | ORAL | Status: AC
  Administered 2017-09-02: 2 via ORAL
  Filled 2017-09-02: qty 2

## 2017-09-02 MED ORDER — ONDANSETRON HCL 4 MG/2ML IJ SOLN: 4.0000 mg | Freq: Once | INTRAMUSCULAR | Status: DC

## 2017-09-02 NOTE — ED Notes (Signed)
IV access attempted x 2 once with Ultrasound. Provider to be notified.

## 2017-09-02 NOTE — ED Notes (Signed)
Pt unable to urinate at this time.  

## 2017-09-02 NOTE — Discharge Instructions (Signed)
Take vicodin for breakthrough pain, do not drink alcohol, drive, care for children or do other critical tasks while taking vicodin.  Only use the arm sling for up to 2 days. Take the arm out and rotate the shoulder every 4 hours.   Please follow with your primary care doctor in the next 2 days for a check-up. They must obtain records for further management.   Do not hesitate to return to the Emergency Department for any new, worsening or concerning symptoms.

## 2017-09-02 NOTE — ED Notes (Signed)
Pt awaiting evaluation by ED provider.
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
## 2017-09-02 NOTE — ED Notes (Signed)
Patient transported to X-ray 
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
## 2018-01-31 ENCOUNTER — Ambulatory Visit (HOSPITAL_COMMUNITY)
Admission: RE | Admit: 2018-01-31 | Discharge: 2018-01-31 | Disposition: A | Discharge status: Home / Self Care | Payer: BLUE CROSS/BLUE SHIELD | Source: Ambulatory Visit | Attending: Family | Admitting: Family

## 2018-01-31 ENCOUNTER — Ambulatory Visit (INDEPENDENT_AMBULATORY_CARE_PROVIDER_SITE_OTHER): Payer: No Typology Code available for payment source | Admitting: Family

## 2018-01-31 ENCOUNTER — Encounter: Payer: Self-pay | Admitting: Family

## 2018-01-31 ENCOUNTER — Other Ambulatory Visit: Payer: Self-pay

## 2018-01-31 VITALS — BP 121/86 | HR 56 | Temp 97.6°F | Resp 14 | Ht 62.0 in | Wt 159.0 lb

## 2018-01-31 DIAGNOSIS — I824Z9 Acute embolism and thrombosis of unspecified deep veins of unspecified distal lower extremity: Secondary | ICD-10-CM | POA: Insufficient documentation

## 2018-01-31 NOTE — Progress Notes (Signed)
CC: Follow up recurrent RLE DVT   History of Present Illness  Rhonda Mcneil is a 62 y.o. (August 03, 1955) female who is status post Right lower extremity venogram, IVUS of right femoral, common femoral, external iliac, common iliac veins and IVC; Angiojet pharmacomechanical thrombectomy for 196 seconds, and Balloon angioplasty of right femoral and common femoral veins with 30mm balloon for recurrent right lower extremity DVT on 07-01-17 by Dr. Donzetta Matters.   She has no residual swelling, no pain in her legs. She is walking she does not have any wounds at this time.  She has minimal varicose veins.  She does not have any skin discoloration or itching.  She is religious with her compression stockings and she is taking Xarelto as well.  She is able to walk without much limitation.  Dr. Donzetta Matters last evaluated pt on 08-02-17. At that time she had essentially no post-thrombotic syndrome at that time with only minimal residual swelling given C3 venous disease and some varicosities.  Dr. 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  Prior to having elective surgery would like her to be on at least 3 months of anticoagulation post procedure.  If she can be on aspirin during the time of procedure that would also be helpful.  Xarelto should be resumed as soon as safe after surgery and she should continue compression stockings afterwards as tolerable as well.  If there are any issues we can see her sooner otherwise 6 months will be her follow-up.   She did not like coumadin because she could not eat vegetables.   She denies any pain or swelling in her legs.  She feels very well.   Past Medical History:  Diagnosis Date  . DVT (deep vein thrombosis) in pregnancy ([place])   . DVT (deep venous thrombosis) ([place])   . Hypertension   . Renal disorder     Social History Social History   Tobacco Use  .  Smoking status: Never Smoker  . Smokeless tobacco: Never Used  Substance Use Topics  . Alcohol use: Yes  . Drug use: No    Family History Family History  Problem Relation Age of Onset  . Cancer Mother     Surgical History Past Surgical History:  Procedure Laterality Date  . CARPAL TUNNEL RELEASE    . PERIPHERAL VASCULAR THROMBECTOMY N/A 07/01/2017   Procedure: PERIPHERAL VASCULAR THROMBECTOMY;  Surgeon: Waynetta Sandy, MD;  Location: [facility];  Service: Cardiovascular;  Laterality: N/A;    Allergies  Allergen Reactions  . Bee Venom Anaphylaxis  . Contrast Media [Iodinated Diagnostic Agents] Itching and Swelling    Current Outpatient Medications  Medication Sig Dispense Refill  . amLODipine (NORVASC) 10 MG tablet Take 1 tablet (10 mg total) by mouth daily. 30 tablet 0  . atenolol-chlorthalidone (TENORETIC) 100-25 MG tablet Take 1 tablet by mouth daily.  2  . cholecalciferol (VITAMIN D) 1000 units tablet Take 1,000 Units by mouth daily.    Marland Kitchen gabapentin (NEURONTIN) 300 MG capsule Take 300 mg by mouth 3 (three) times daily as needed (pain).     . Rivaroxaban 15 & 20 MG TBPK Take as directed on package: Start with one 15mg  tablet by mouth twice a day with food. On Day 22, switch to one 20mg  tablet once a day with food. 51 each 0  . vitamin B-12 (CYANOCOBALAMIN) 1000 MCG tablet  Take 1,000 mcg by mouth daily.    Alveda Reasons 20 MG TABS tablet TK 1 T PO  QD WF  6  . HYDROcodone-acetaminophen (NORCO/VICODIN) 5-325 MG tablet Take 1-2 tablets by mouth every 6 hours as needed for pain. (Patient not taking: Reported on 01/31/2018) 15 tablet 0   No current facility-administered medications for this visit.     On ROS today: See HPI for pertinent positives and negatives.  Physical Examination  Vitals:   01/31/18 1419 01/31/18 1422  BP: (!) 155/90 121/86  Pulse: (!) 54 (!) 56  Resp: 14   Temp: 97.6 F (36.4 C)   TempSrc: Oral   SpO2: 100%   Weight: 159 lb (72.1 kg)    Height: 5\' 2"  (1.575 m)    Body mass index is 29.08 kg/m.  General: A&O x 3, WD female Gait: normal HEENT: Grossly intact and WNL.  Pulmonary: Sym exp, good air movt, CTAB, no rales, rhonchi, & wheezing. Cardiac: Regular rate and rhythm, no detected murmur.  Vascular: Vessel Right Left  Radial Palpable Palpable  Brachial Palpable Palpable  Carotid  without bruit  without bruit  Aorta Not palpable N/A  Popliteal Not palpable Not palpable  PT faintlyPalpable Palpable  DP faintlyPalpable Palpable   Gastrointestinal: soft, NTND, -G/R, - HSM, - palpable masses, - CVAT B. Musculoskeletal: M/S 5/5 throughout, Extremities without ischemic changes. Skin: No rash, no cellulitis, no ulcers noted.  Neurologic: Pain and light touch intact in extremities, Motor exam as listed above. Psychiatric: Normal thought content, mood appropriate to clinical situation.     Medical Decision Making  [name] is a 62 y.o. female who is status post Right lower extremity venogram, IVUS of right femoral, common femoral, external iliac, common iliac veins and IVC; Angiojet pharmacomechanical thrombectomy for 196 seconds, and Balloon angioplasty of right femoral and common femoral veins with 9mm balloon for recurrent right lower extremity DVT on 07-01-17 by Dr. Donzetta Matters.   She feels very well with no pain or swelling.   Bilateral venous duplex today shows no evidence of DVT in the right LE, but does have reflux in the deep venous system.  Left LE with sequela of prior venous obstructive process, chronic in femoral and popliteal vein. Left LE with reflux of the deep venous and superficial systems.   Continue lifelong Xarelto. I tried to reorder Xarelto for pt, and it appears that it was ordered last month with 5 refills.  Continue daily use of graduated compression hose.  Pt to follow up with [place] prn.    Thank you for allowing [place] to participate in this patient's care.  Clemon Chambers, RN, MSN,  FNP-C [facility] Office: [phone]  Clinic MD: Donzetta Matters  01/31/2018, 2:28 PM

## 2018-01-31 NOTE — Patient Instructions (Signed)
Deep Vein Thrombosis Deep vein thrombosis (DVT) is a condition in which a blood clot forms in a deep vein, such as a lower leg, thigh, or arm vein. A clot is blood that has thickened into a gel or solid. This condition is dangerous. It can lead to serious and even life-threatening complications if the clot travels to the lungs and causes a blockage (pulmonary embolism). It can also damage veins in the leg. This can result in leg pain, swelling, discoloration, and sores (post-thrombotic syndrome). What are the causes? This condition may be caused by:  A slowdown of blood flow.  Damage to a vein.  A condition that makes blood clot more easily.  What increases the risk? The following factors may make you more likely to develop this condition:  Being overweight.  Being elderly, especially over age 60.  Sitting or lying down for more than four hours.  Lack of physical activity (sedentary lifestyle).  Being pregnant, giving birth, or having recently given birth.  Taking medicines that contain estrogen.  Smoking.  A history of any of the following: ? Blood clots or blood clotting disease. ? Peripheral vascular disease. ? Inflammatory bowel disease. ? Cancer. ? Heart disease. ? Genetic conditions that affect how blood clots. ? Neurological diseases that affect the legs (leg paresis). ? Injury. ? Major or lengthy surgery. ? A central line placed inside a large vein.  What are the signs or symptoms? Symptoms of this condition include:  Swelling, pain, or tenderness in an arm or leg.  Warmth, redness, or discoloration in an arm or leg.  If the clot is in your leg, symptoms may be more noticeable or worse when you stand or walk. Some people do not have any symptoms. How is this diagnosed? This condition is diagnosed with:  A medical history.  A physical exam.  Tests, such as: ? Blood tests. These are done to see how your blood clots. ? Imaging tests. These are done to  check for clots. Tests may include:  Ultrasound.  CT scan.  MRI.  X-ray.  Venogram. For this test, X-rays are taken after a dye is injected into a vein.  How is this treated? Treatment for this condition depends on the cause, your risk for bleeding or developing more clots, and any medical conditions you have. Treatment may include:  Taking blood thinners (also called anticoagulants). These medicines may be taken by mouth, injected under the skin, or injected through an IV tube (catheter). These medicines prevent clots from forming.  Injecting medicine that dissolves blood clots into the affected vein (catheter-directed thrombolysis).  Having surgery. Surgery may be done to: ? Remove the clot. ? Place a filter in a large vein to catch blood clots before they reach the lungs.  Some treatments may be continued for up to six months. Follow these instructions at home: If you are taking an oral blood thinner:  Take the medicine exactly as told by your health care provider. Some blood thinners need to be taken at the same time every day. Do not skip a dose.  Ask your health care provider about what foods and drugs interact with the medicine.  Ask about possible side effects. General instructions  Blood thinners can cause easy bruising and difficulty stopping bleeding. Because of this, if you are taking or were given a blood thinner: ? Hold pressure over cuts for longer than usual. ? Tell your dentist and other health care providers that you are taking blood thinners before   having any procedures that can cause bleeding. ? Avoid contact sports.  Take over-the-counter and prescription medicines only as told by your health care provider.  Return to your normal activities as told by your health care provider. Ask your health care provider what activities are safe for you.  Wear compression stockings if recommended by your health care provider.  Keep all follow-up visits as told by  your health care provider. This is important. How is this prevented? To lower your risk of developing this condition again:  For 30 or more minutes every day, do an activity that: ? Involves moving your arms and legs. ? Increases your heart rate.  When traveling for longer than four hours: ? Exercise your arms and legs every hour. ? Drink plenty of water. ? Avoid drinking alcohol.  Avoid sitting or lying for a long time without moving your legs.  Stay a healthy weight.  If you are a woman who is older than age 35, avoid unnecessary use of medicines that contain estrogen.  Do not use any products that contain nicotine or tobacco, such as cigarettes and e-cigarettes. This is especially important if you take estrogen medicines. If you need help quitting, ask your health care provider.  Contact a health care provider if:  You miss a dose of your blood thinner.  You have nausea, vomiting, or diarrhea that lasts for more than one day.  Your menstrual period is heavier than usual.  You have unusual bruising. Get help right away if:  You have new or increased pain, swelling, or redness in an arm or leg.  You have numbness or tingling in an arm or leg.  You have shortness of breath.  You have chest pain.  You have a rapid or irregular heartbeat.  You feel light-headed or dizzy.  You cough up blood.  There is blood in your vomit, stool, or urine.  You have a serious fall or accident, or you hit your head.  You have a severe headache or confusion.  You have a cut that will not stop bleeding. These symptoms may represent a serious problem that is an emergency. Do not wait to see if the symptoms will go away. Get medical help right away. Call your local emergency services (911 in the [place] Do not drive yourself to the hospital. Summary  DVT is a condition in which a blood clot forms in a deep vein, such as a lower leg, thigh, or arm vein.  Symptoms can include swelling,  warmth, pain, and redness in your leg or arm.  Treatment may include taking blood thinners, injecting medicine that dissolves blood clots,wearing compression stockings, or surgery.  If you are prescribed blood thinners, take them exactly as told. This information is not intended to replace advice given to you by your health care provider. Make sure you discuss any questions you have with your health care provider. Document Released: 06/11/2005 Document Revised: 07/14/2016 Document Reviewed: 07/14/2016 Elsevier Interactive Patient Education  2018 Elsevier Inc.  

## 2018-03-05 ENCOUNTER — Other Ambulatory Visit: Payer: Self-pay | Admitting: Vascular Surgery

## 2018-03-13 ENCOUNTER — Other Ambulatory Visit: Payer: Self-pay | Admitting: Nephrology

## 2018-03-13 DIAGNOSIS — N183 Chronic kidney disease, stage 3 unspecified: Secondary | ICD-10-CM

## 2018-03-13 DIAGNOSIS — I129 Hypertensive chronic kidney disease with stage 1 through stage 4 chronic kidney disease, or unspecified chronic kidney disease: Secondary | ICD-10-CM

## 2018-03-18 ENCOUNTER — Other Ambulatory Visit: Payer: No Typology Code available for payment source

## 2018-03-26 ENCOUNTER — Ambulatory Visit
Admission: RE | Admit: 2018-03-26 | Discharge: 2018-03-26 | Disposition: A | Discharge status: Home / Self Care | Payer: No Typology Code available for payment source | Source: Ambulatory Visit | Attending: Nephrology | Admitting: Nephrology

## 2018-03-26 DIAGNOSIS — N183 Chronic kidney disease, stage 3 unspecified: Secondary | ICD-10-CM

## 2018-03-26 DIAGNOSIS — I129 Hypertensive chronic kidney disease with stage 1 through stage 4 chronic kidney disease, or unspecified chronic kidney disease: Secondary | ICD-10-CM

## 2018-04-03 ENCOUNTER — Other Ambulatory Visit: Payer: Self-pay | Admitting: Nephrology

## 2018-04-03 DIAGNOSIS — N2889 Other specified disorders of kidney and ureter: Secondary | ICD-10-CM

## 2018-04-03 DIAGNOSIS — N289 Disorder of kidney and ureter, unspecified: Secondary | ICD-10-CM

## 2018-04-09 ENCOUNTER — Ambulatory Visit
Admission: RE | Admit: 2018-04-09 | Discharge: 2018-04-09 | Disposition: A | Discharge status: Home / Self Care | Payer: No Typology Code available for payment source | Source: Ambulatory Visit | Attending: Nephrology | Admitting: Nephrology

## 2018-04-09 DIAGNOSIS — N289 Disorder of kidney and ureter, unspecified: Secondary | ICD-10-CM

## 2018-04-09 DIAGNOSIS — N2889 Other specified disorders of kidney and ureter: Secondary | ICD-10-CM

## 2018-04-28 ENCOUNTER — Other Ambulatory Visit: Payer: Self-pay | Admitting: Urology

## 2018-04-28 DIAGNOSIS — D3000 Benign neoplasm of unspecified kidney: Secondary | ICD-10-CM

## 2018-05-12 ENCOUNTER — Ambulatory Visit
Admission: RE | Admit: 2018-05-12 | Discharge: 2018-05-12 | Disposition: A | Discharge status: Home / Self Care | Payer: No Typology Code available for payment source | Source: Ambulatory Visit | Attending: Urology | Admitting: Urology

## 2018-05-12 DIAGNOSIS — D3000 Benign neoplasm of unspecified kidney: Secondary | ICD-10-CM

## 2018-05-12 MED ORDER — GADOBENATE DIMEGLUMINE 529 MG/ML IV SOLN
7.0000 mL | Freq: Once | INTRAVENOUS | Status: AC | PRN
  Administered 2018-05-12: 7 mL via INTRAVENOUS

## 2018-09-10 ENCOUNTER — Other Ambulatory Visit: Payer: Self-pay | Admitting: Vascular Surgery

## 2019-03-17 ENCOUNTER — Other Ambulatory Visit: Payer: Self-pay | Admitting: Vascular Surgery

## 2019-03-17 NOTE — Telephone Encounter (Signed)
Patient on lifelong Xarelto as per last note by our NP. I have filled this Rx but patient needs to make a follow up appt.

## 2019-04-04 ENCOUNTER — Other Ambulatory Visit: Payer: Self-pay

## 2019-04-04 ENCOUNTER — Emergency Department (HOSPITAL_COMMUNITY)
Admission: EM | Admit: 2019-04-04 | Discharge: 2019-04-26 | Disposition: E | Payer: Medicare Other | Attending: Emergency Medicine | Admitting: Emergency Medicine

## 2019-04-04 ENCOUNTER — Encounter (HOSPITAL_COMMUNITY): Payer: Self-pay | Admitting: Emergency Medicine

## 2019-04-04 ENCOUNTER — Emergency Department (HOSPITAL_COMMUNITY): Payer: Medicare Other

## 2019-04-04 ENCOUNTER — Ambulatory Visit (INDEPENDENT_AMBULATORY_CARE_PROVIDER_SITE_OTHER)
Admission: EM | Admit: 2019-04-04 | Discharge: 2019-04-04 | Disposition: A | Discharge status: Home / Self Care | Payer: Medicare Other | Source: Home / Self Care

## 2019-04-04 DIAGNOSIS — R29898 Other symptoms and signs involving the musculoskeletal system: Secondary | ICD-10-CM

## 2019-04-04 DIAGNOSIS — Z79899 Other long term (current) drug therapy: Secondary | ICD-10-CM | POA: Insufficient documentation

## 2019-04-04 DIAGNOSIS — N3000 Acute cystitis without hematuria: Secondary | ICD-10-CM

## 2019-04-04 DIAGNOSIS — R5383 Other fatigue: Secondary | ICD-10-CM

## 2019-04-04 DIAGNOSIS — I10 Essential (primary) hypertension: Secondary | ICD-10-CM | POA: Diagnosis not present

## 2019-04-04 DIAGNOSIS — R001 Bradycardia, unspecified: Secondary | ICD-10-CM | POA: Diagnosis not present

## 2019-04-04 DIAGNOSIS — R269 Unspecified abnormalities of gait and mobility: Secondary | ICD-10-CM

## 2019-04-04 DIAGNOSIS — Z20828 Contact with and (suspected) exposure to other viral communicable diseases: Secondary | ICD-10-CM | POA: Diagnosis not present

## 2019-04-04 DIAGNOSIS — R531 Weakness: Secondary | ICD-10-CM | POA: Insufficient documentation

## 2019-04-04 LAB — URINALYSIS, ROUTINE W REFLEX MICROSCOPIC
Bacteria, UA: NONE SEEN
Bilirubin Urine: NEGATIVE
Glucose, UA: NEGATIVE mg/dL
Hgb urine dipstick: NEGATIVE
Ketones, ur: NEGATIVE mg/dL
Nitrite: NEGATIVE
Protein, ur: NEGATIVE mg/dL
Specific Gravity, Urine: 1.011 (ref 1.005–1.030)
pH: 6 (ref 5.0–8.0)

## 2019-04-04 LAB — SARS CORONAVIRUS 2 (TAT 6-24 HRS): SARS Coronavirus 2: NEGATIVE

## 2019-04-04 LAB — CBC
HCT: 44.7 % (ref 36.0–46.0)
Hemoglobin: 14.4 g/dL (ref 12.0–15.0)
MCH: 28.3 pg (ref 26.0–34.0)
MCHC: 32.2 g/dL (ref 30.0–36.0)
MCV: 87.8 fL (ref 80.0–100.0)
Platelets: 325 10*3/uL (ref 150–400)
RBC: 5.09 MIL/uL (ref 3.87–5.11)
RDW: 14.3 % (ref 11.5–15.5)
WBC: 12.2 10*3/uL — ABNORMAL HIGH (ref 4.0–10.5)
nRBC: 0 % (ref 0.0–0.2)

## 2019-04-04 LAB — BASIC METABOLIC PANEL
Anion gap: 12 (ref 5–15)
BUN: 10 mg/dL (ref 8–23)
CO2: 26 mmol/L (ref 22–32)
Calcium: 9.9 mg/dL (ref 8.9–10.3)
Chloride: 104 mmol/L (ref 98–111)
Creatinine, Ser: 1.41 mg/dL — ABNORMAL HIGH (ref 0.44–1.00)
GFR calc Af Amer: 45 mL/min — ABNORMAL LOW (ref >60)
GFR calc non Af Amer: 39 mL/min — ABNORMAL LOW (ref >60)
Glucose, Bld: 125 mg/dL — ABNORMAL HIGH (ref 70–99)
Potassium: 3.5 mmol/L (ref 3.5–5.1)
Sodium: 142 mmol/L (ref 135–145)

## 2019-04-04 LAB — CBG MONITORING, ED
Glucose-Capillary: 101 mg/dL — ABNORMAL HIGH (ref 70–99)
Glucose-Capillary: 127 mg/dL — ABNORMAL HIGH (ref 70–99)

## 2019-04-04 LAB — POCT URINALYSIS DIP (DEVICE)
Bilirubin Urine: NEGATIVE
Glucose, UA: NEGATIVE mg/dL
Hgb urine dipstick: NEGATIVE
Ketones, ur: NEGATIVE mg/dL
Leukocytes,Ua: NEGATIVE
Nitrite: NEGATIVE
Protein, ur: NEGATIVE mg/dL
Specific Gravity, Urine: 1.025 (ref 1.005–1.030)
Urobilinogen, UA: 1 mg/dL (ref 0.0–1.0)
pH: 7 (ref 5.0–8.0)

## 2019-04-04 LAB — TROPONIN I (HIGH SENSITIVITY)
Troponin I (High Sensitivity): 31 ng/L — ABNORMAL HIGH (ref <18)
Troponin I (High Sensitivity): 36 ng/L — ABNORMAL HIGH (ref <18)

## 2019-04-04 LAB — GLUCOSE, CAPILLARY: Glucose-Capillary: 125 mg/dL — ABNORMAL HIGH (ref 70–99)

## 2019-04-04 LAB — TSH: TSH: 0.644 u[IU]/mL (ref 0.350–4.500)

## 2019-04-04 MED ORDER — SODIUM CHLORIDE 0.9 % IV BOLUS: 1000.0000 mL | Freq: Once | INTRAVENOUS | Status: DC

## 2019-04-04 MED ORDER — PROPOFOL 1000 MG/100ML IV EMUL
INTRAVENOUS | Status: AC
  Filled 2019-04-04: qty 100

## 2019-04-06 MED FILL — Medication: Qty: 1 | Status: AC

## 2019-04-26 NOTE — ED Provider Notes (Signed)
[facility] Provider Note   CSN: [id number] Arrival date & time: 2019/04/11  1217     History   Chief Complaint Chief Complaint  Patient presents with  . Weakness    HPI Rhonda Mcneil is a 63 y.o. female.     The history is provided by the patient and medical records. No language interpreter was used.  Weakness    63 year old female with history of hypertension, chronic kidney disease, DVT sent here from urgent care center with complaints of weakness.  Per patient for the past week she has noticed progressive weakness to her legs as well as her arms bilaterally.  States when she walks she feels as if her arms or legs are much heavier and she feels weak and tired.  She does not report any associated pain with it.  She does not complain of any fever chills, URI symptoms, headache, vision changes, neck pain, chest pain, trouble breathing, abdominal pain or back pain or dysuria.  She mention been evaluated by her her care doctor several days ago and was diagnosed with having urinary tract infection even though she did not have any urinary symptoms.  She was started on an antibiotic "?cephalexin".  She report only have 1 dose of the antibiotic so far.  She went to urgent care center due to her weakness and during her evaluation, she was noted to be bradycardic.  Patient then sent to the ER for further evaluation.  Patient admits that she has history of high blood pressure and currently on 4 different blood pressure medication.  She has not had her blood pressure medication today.  She denies any recent sick contact.  She denies depression.  Past Medical History:  Diagnosis Date  . DVT (deep vein thrombosis) in pregnancy   . DVT (deep venous thrombosis) ([place])   . Hypertension   . Renal disorder     Patient Active Problem List   Diagnosis Date Noted  . DVT (deep venous thrombosis) ([place]) 07/01/2017  . Pneumonia 05/19/2017    Past Surgical History:   Procedure Laterality Date  . CARPAL TUNNEL RELEASE    . PERIPHERAL VASCULAR THROMBECTOMY N/A 07/01/2017   Procedure: PERIPHERAL VASCULAR THROMBECTOMY;  Surgeon: Waynetta Sandy, MD;  Location: [facility];  Service: Cardiovascular;  Laterality: N/A;     OB History   No obstetric history on file.      Home Medications    Prior to Admission medications   Medication Sig Start Date End Date Taking? Authorizing Provider  amLODipine (NORVASC) 10 MG tablet Take 1 tablet (10 mg total) by mouth daily. 03/20/17   Robinson, [place] N, PA-C  atenolol-chlorthalidone (TENORETIC) 100-25 MG tablet Take 1 tablet by mouth daily. 06/20/17   [provider]  cholecalciferol (VITAMIN D) 1000 units tablet Take 1,000 Units by mouth daily.    [provider]  gabapentin (NEURONTIN) 300 MG capsule Take 300 mg by mouth 3 (three) times daily as needed (pain).     [provider]  lisinopril (ZESTRIL) 20 MG tablet Take 20 mg by mouth daily.    [provider]  vitamin B-12 (CYANOCOBALAMIN) 1000 MCG tablet Take 1,000 mcg by mouth daily.    [provider]  XARELTO 20 MG TABS tablet TAKE 1 TABLET BY MOUTH EVERY DAY WITH FOOD 03/17/19   Waynetta Sandy, MD    Family History Family History  Problem Relation Age of Onset  . Cancer Mother  Social History Social History   Tobacco Use  . Smoking status: Never Smoker  . Smokeless tobacco: Never Used  Substance Use Topics  . Alcohol use: Yes  . Drug use: No     Allergies   Bee venom and Contrast media [iodinated diagnostic agents]   Review of Systems Review of Systems  Neurological: Positive for weakness.  All other systems reviewed and are negative.    Physical Exam Updated Vital Signs BP (!) 162/78 (BP Location: Right Arm)   Pulse (!) 48   Temp 98.2 F (36.8 C) (Oral)   Resp 18   SpO2 100%   Physical Exam Vitals signs and nursing note reviewed.  Constitutional:       General: She is not in acute distress.    Appearance: She is well-developed.  HENT:     Head: Atraumatic.  Eyes:     Conjunctiva/sclera: Conjunctivae normal.  Neck:     Musculoskeletal: Neck supple.  Cardiovascular:     Rate and Rhythm: Bradycardia present.     Pulses: Normal pulses.     Heart sounds: Normal heart sounds.  Pulmonary:     Effort: Pulmonary effort is normal.     Breath sounds: Normal breath sounds.  Abdominal:     Palpations: Abdomen is soft.  Musculoskeletal:     Comments: 5 out of 5 strength all 4 extremities with intact distal pulses.  Skin:    Findings: No rash.  Neurological:     Mental Status: She is alert and oriented to person, place, and time.     GCS: GCS eye subscore is 4. GCS verbal subscore is 5. GCS motor subscore is 6.     Cranial Nerves: Cranial nerves are intact.     Motor: Motor function is intact.     Coordination: Coordination is intact.  Psychiatric:     Comments: Calm and cooperative.      ED Treatments / Results  Labs (all labs ordered are listed, but only abnormal results are displayed) Labs Reviewed  CBC - Abnormal; Notable for the following components:      Result Value   WBC 12.2 (*)    All other components within normal limits  CBG MONITORING, ED - Abnormal; Notable for the following components:   Glucose-Capillary 101 (*)    All other components within normal limits  SARS CORONAVIRUS 2 (TAT 6-24 HRS)  BASIC METABOLIC PANEL  URINALYSIS, ROUTINE W REFLEX MICROSCOPIC  TSH  TROPONIN I (HIGH SENSITIVITY)  TROPONIN I (HIGH SENSITIVITY)    EKG EKG Interpretation  Date/Time:  04/15/2019 12:30:42 EDT Ventricular Rate:  50 PR Interval:  120 QRS Duration: 88 QT Interval:  454 QTC Calculation: 413 R Axis:   6 Text Interpretation:  Sinus bradycardia ST & T wave abnormality, consider anterolateral ischemia Abnormal ECG ischemic pattern similar to older tracings Confirmed by Charlesetta Shanks [phone]) on Apr 15, 2019  12:36:46 PM   Radiology Dg Chest 2 View  Result Date: 04-15-2019 CLINICAL DATA:  Patient reports he started feeling bad about 6 days ago. She has been been feeling generally weak and getting episodes of feeling like her legs are really heavy and then she feels like she is getting hot flashes. EXAM: CHEST - 2 VIEW COMPARISON:  CT chest 09/02/2017, chest radiograph 05/19/2017 FINDINGS: Stable cardiomediastinal contours. Normal heart size. Minimal scattered linear opacities in the bilateral lung bases likely reflect scarring or atelectasis. No new focal infiltrate. No pneumothorax or pleural effusion. Scattered degenerative changes are seen  throughout the thoracic spine. IMPRESSION: 1. No active cardiopulmonary disease. 2. Minimal bibasilar scarring or atelectasis. Electronically Signed   By: Audie Pinto M.D.   On: April 11, 2019 15:08    Procedures Procedures (including critical care time)  Medications Ordered in ED Medications - No data to display   Initial Impression / Assessment and Plan / ED Course  I have reviewed the triage vital signs and the nursing notes.  Pertinent labs & imaging results that were available during my care of the patient were reviewed by me and considered in my medical decision making (see chart for details).        BP (!) 162/78 (BP Location: Right Arm)   Pulse (!) 48   Temp 98.2 F (36.8 C) (Oral)   Resp 18   SpO2 100%    Final Clinical Impressions(s) / ED Diagnoses   Final diagnoses:  None    ED Discharge Orders    None     1:05 PM Patient sent here to the ER for further evaluation of her generalized weakness as well as bradycardia.  She is currently on 4 different blood pressure medication which may contribute to her bradycardic state.  She was recently diagnosed with have a urine tract infection however she denies having any dysuria.  She only had 1 dose of antibiotic thus far.  Her UA shows no evidence of urinary tract infection.  She does  endorse occasional bouts of hot flashes and chills.  Will check COVID-19  EKG shows diffuse T wave inversion, similar to prior.  Care discussed with Dr. Adaline Sill was evaluated in Emergency Department on 2019/04/11 for the symptoms described in the history of present illness. She was evaluated in the context of the global COVID-19 pandemic, which necessitated consideration that the patient might be at risk for infection with the SARS-CoV-2 virus that causes COVID-19. Institutional protocols and algorithms that pertain to the evaluation of patients at risk for COVID-19 are in a state of rapid change based on information released by regulatory bodies including the CDC and federal and state organizations. These policies and algorithms were followed during the patient's care in the ED.  3:44 PM Suspect generalized weakness is secondary to her bradycardia.  She will need to follow-up closely with her PCP for further management of her medication.  Currently awaits for labs.  A COVID-19 test has been ordered.  Patient signed out to Rhonda Hora, PA-C who will reassess pt once her labs have resulted and dispo as appropriate.    Domenic Moras, PA-C 2019/04/11 [place], MD 04/05/19 [phone]

## 2019-04-26 NOTE — ED Notes (Signed)
Two unsuccessful IV attempts.

## 2019-04-26 NOTE — Progress Notes (Signed)
Patient was bagged manually ventilated via ETT throughout the duration of the CODE. All resuscitation attempts were exhausted and NO ROSC was achieved.

## 2019-04-26 NOTE — ED Triage Notes (Signed)
Pt reports legs and arm feel heavy and tight. She took one abx for UTI so far. Has not taken morning meds today.

## 2019-04-26 NOTE — ED Notes (Signed)
IV team responded for consult during code; will consult back if needed

## 2019-04-26 NOTE — ED Triage Notes (Signed)
Pt states two days ago she went to her PCP for fatigue, body pain, and was dx with a UTI, was given cipro and has not started it yet. Pt states "my legs just feel tired".

## 2019-04-26 NOTE — ED Provider Notes (Signed)
Medical screening examination/treatment/procedure(s) were conducted as a shared visit with non-physician practitioner(s) and myself.  I personally evaluated the patient during the encounter.  EKG Interpretation  Date/Time:  04-23-19 12:30:42 EDT Ventricular Rate:  50 PR Interval:  120 QRS Duration: 88 QT Interval:  454 QTC Calculation: 413 R Axis:   6 Text Interpretation:  Sinus bradycardia ST & T wave abnormality, consider anterolateral ischemia Abnormal ECG ischemic pattern similar to older tracings Confirmed by Charlesetta Shanks [phone]) on April 23, 2019 12:36:46 PM Patient reports he started feeling bad about 6 days ago.  She has been been feeling generally weak and getting episodes of feeling like her legs are really heavy and then she feels like she is getting hot flashes.  She reports sometimes she gets hot and cold.  She denies that she is getting any documented fever.  Patient denies she has had productive cough.  She reports she did have loss of appetite but at this time feels somewhat more hungry.  She went to see her doctor on Thursday was diagnosed with a urinary tract infection.  She however was not able to fill her antibiotics until yesterday and took 1 dose yesterday.  She denies she is having any abdominal pain or flank pain.  No known exposures to COVID.  Patient is alert and appropriate.  Mental status clear.  Heart regular and bradycardic.  Lungs are clear.  Abdomen is soft and nontender.  Calves are soft and nontender.  I agree with plan of management.   Charlesetta Shanks, MD 23-Apr-2019 1351

## 2019-04-26 NOTE — ED Notes (Signed)
Heat pack applied to help facilitate blood draw

## 2019-04-26 NOTE — ED Notes (Signed)
Pt has been reminded of the need for urine several times.

## 2019-04-26 NOTE — ED Notes (Signed)
3 sticks required to obtain labs.

## 2019-04-26 NOTE — ED Notes (Signed)
Confirmed with oncoming EDP that repeat trop is needed.

## 2019-04-26 NOTE — ED Provider Notes (Signed)
[place]    CSN: [id number] Arrival date & time: 2019-04-14  1012      History   Chief Complaint Chief Complaint  Patient presents with  . Fatigue  . Urinary Tract Infection    HPI Rhonda Mcneil is a 63 y.o. female. who presents complaining of fatigue and   lower extremity and upper extremity weakness, and feeling uncomfortable all over, but cant describe it what she means. Was seen at her PCP 2 days ago with same symptoms and she was prescribed but did not pick it up til last night, abd only took one dose, but did not take it since she was trying to make a strict schedule to take it q  12h. She denies having frequency, urgency, dysuria since onset of symptoms. She feels she is getting worse. Today she could hardly stand up to fix herself food, and had to sit down. She denies CP or SOB. Has been feeling hot and then sweating since onset of symptoms. And right now feels a pressure on the sides of her chest below her axilla, but is not painful to touch.  She had her Flu shot the day she saw her PCP. She denies receiving any other immunizations in the past month.     Past Medical History:  Diagnosis Date  . DVT (deep vein thrombosis) in pregnancy   . DVT (deep venous thrombosis) ([place])   . Hypertension   . Renal disorder     Patient Active Problem List   Diagnosis Date Noted  . DVT (deep venous thrombosis) ([place]) 07/01/2017  . Pneumonia 05/19/2017    Past Surgical History:  Procedure Laterality Date  . CARPAL TUNNEL RELEASE    . PERIPHERAL VASCULAR THROMBECTOMY N/A 07/01/2017   Procedure: PERIPHERAL VASCULAR THROMBECTOMY;  Surgeon: Waynetta Sandy, MD;  Location: [facility];  Service: Cardiovascular;  Laterality: N/A;    OB History   No obstetric history on file.      Home Medications    Prior to Admission medications   Medication Sig Start Date End Date Taking? Authorizing Provider  lisinopril (ZESTRIL) 20 MG tablet Take 20 mg by mouth  daily.   Yes [provider]  amLODipine (NORVASC) 10 MG tablet Take 1 tablet (10 mg total) by mouth daily. 03/20/17   Robinson, [place] N, PA-C  atenolol-chlorthalidone (TENORETIC) 100-25 MG tablet Take 1 tablet by mouth daily. 06/20/17   [provider]  cholecalciferol (VITAMIN D) 1000 units tablet Take 1,000 Units by mouth daily.    [provider]  gabapentin (NEURONTIN) 300 MG capsule Take 300 mg by mouth 3 (three) times daily as needed (pain).     [provider]  vitamin B-12 (CYANOCOBALAMIN) 1000 MCG tablet Take 1,000 mcg by mouth daily.    [provider]  XARELTO 20 MG TABS tablet TAKE 1 TABLET BY MOUTH EVERY DAY WITH FOOD 03/17/19   Waynetta Sandy, MD    Family History Family History  Problem Relation Age of Onset  . Cancer Mother     Social History Social History   Tobacco Use  . Smoking status: Never Smoker  . Smokeless tobacco: Never Used  Substance Use Topics  . Alcohol use: Yes  . Drug use: No     Allergies   Bee venom and Contrast media [iodinated diagnostic agents]   Review of Systems Review of Systems  Constitutional: Positive for diaphoresis and fatigue. Negative for activity change, appetite change, chills and fever.  HENT:  Negative for congestion, ear discharge, ear pain, rhinorrhea, sore throat and trouble swallowing.   Eyes: Negative for discharge and visual disturbance.  Respiratory: Negative for cough, chest tightness and shortness of breath.   Cardiovascular: Negative for chest pain, palpitations and leg swelling.  Gastrointestinal: Positive for nausea. Negative for abdominal distention, abdominal pain, constipation, diarrhea and vomiting.  Genitourinary: Negative for difficulty urinating, dysuria, frequency and urgency.  Musculoskeletal: Positive for gait problem. Negative for arthralgias, back pain, joint swelling, myalgias and neck pain.  Skin: Negative for rash.  Neurological: Positive for  weakness. Negative for dizziness, speech difficulty, light-headedness, numbness and headaches.  Hematological: Negative for adenopathy.     Physical Exam Triage Vital Signs ED Triage Vitals  Enc Vitals Group     BP 04/30/19 1034 140/77     Pulse Rate 2019-04-30 1034 60     Resp 04-30-19 1034 18     Temp 30-Apr-2019 1034 (!) 97.3 F (36.3 C)     Temp src --      SpO2 04/30/2019 1034 100 %     Weight --      Height --      Head Circumference --      Peak Flow --      Pain Score 04-30-19 1041 0     Pain Loc --      Pain Edu? --      Excl. in [place]? --    No data found.  Updated Vital Signs BP 140/77   Pulse 60   Temp (!) 97.3 F (36.3 C)   Resp 18   SpO2 100%   Visual Acuity Right Eye Distance:   Left Eye Distance:   Bilateral Distance:    Right Eye Near:   Left Eye Near:    Bilateral Near:     Physical Exam Vitals signs and nursing note reviewed.  Constitutional:      General: She is not in acute distress.    Appearance: She is obese. She is not ill-appearing, toxic-appearing or diaphoretic.     Comments: She is huffing and puffing and saying how bad she feels " I feel so uncomfortable"  HENT:     Head: Atraumatic.     Right Ear: External ear normal.     Left Ear: External ear normal.  Eyes:     General: No scleral icterus.    Conjunctiva/sclera: Conjunctivae normal.  Neck:     Musculoskeletal: Neck supple.  Cardiovascular:     Rate and Rhythm: Normal rate and regular rhythm.     Pulses: Normal pulses.     Heart sounds: No murmur.  Pulmonary:     Effort: Pulmonary effort is normal. No respiratory distress.     Breath sounds: Normal breath sounds. No stridor. No wheezing or rales.  Chest:     Chest wall: No tenderness.  Abdominal:     General: Bowel sounds are normal. There is no distension.     Palpations: Abdomen is soft. There is no mass.     Tenderness: There is no abdominal tenderness. There is no guarding or rebound.     Hernia: No hernia is present.   Musculoskeletal: Normal range of motion.        General: No swelling, tenderness, deformity or signs of injury.     Right lower leg: No edema.     Left lower leg: No edema.  Lymphadenopathy:     Cervical: No cervical adenopathy.  Skin:    General: Skin is warm  and dry.  Neurological:     Mental Status: She is alert and oriented to person, place, and time.     Motor: Weakness present.     Coordination: Coordination normal.     Gait: Gait abnormal.     Comments: Needs assistance to walk which she never does as her normal.  Her speech is normal, does not have asymmetric weakness.  Psychiatric:        Mood and Affect: Mood normal.        Behavior: Behavior normal.        Thought Content: Thought content normal.        Judgment: Judgment normal.   ABD- + BS, soft, no masses, tenderness or HSM. Neg CVA tenderness.   UC Treatments / Results  Labs (all labs ordered are listed, but only abnormal results are displayed) Labs Reviewed  GLUCOSE, CAPILLARY - Abnormal; Notable for the following components:      Result Value   Glucose-Capillary 125 (*)    All other components within normal limits  POCT URINALYSIS DIP (DEVICE)  CBG MONITORING, ED   UA- negative EKG Looks unchanged compared to the one from 2018  Radiology No results found.  Procedures Procedures   Medications Ordered in UC Medications - No data to display  Initial Impression / Assessment and Plan / UC Course  I have reviewed the triage vital signs and the nursing notes. Pertinent labs results that were available during my care of the patient were reviewed by me and considered in my medical decision making (see chart for details). I explained to pt that because she feels so bad, she need to have further evaluation and I dont have the ability to do this here. I sent her to ER and she was wheeled by our nurse.    Final Clinical Impressions(s) / UC Diagnoses   Final diagnoses:  Acute cystitis without hematuria   Weakness of both lower extremities     Discharge Instructions     YOU NEED TO GO TO ER FOR FURTHER WORK UP PLEASE.  YOUR EKG LOOKS UNCHANGED, AND YOUR SUGAR WAS 125.     ED Prescriptions    None     PDMP not reviewed this encounter.   Shelby Mattocks, PA-C 04-28-2019 1211

## 2019-04-26 NOTE — ED Notes (Signed)
CBG collected. Result "101." PA, Rona Ravens, notified.

## 2019-04-26 NOTE — Procedures (Addendum)
Intubation Procedure Note Loyalty Arentz [id number] 09-24-1955  Procedure: Intubation Indications: Respiratory insufficiency   Procedure Details Consent: Unable to obtain consent because of emergent medical necessity. Time Out: Verified patient identification, verified procedure, site/side was marked, verified correct patient position, special equipment/implants available, medications/allergies/relevent history reviewed, required imaging and test results available.  Performed  Maximum sterile technique was used including antiseptics, gloves, hand hygiene and mask.  MAC and 4   Evaluation Hemodynamic Status: Transient hypotension BP unstable throughout the CODE; O2 sats: Were undetectable on arrival and prior to intubation. Once I intubated patient Oxygen saturation were stable in the 90's. Patient currently remains hemodynamically unstable at this time. Patient's Current Condition: UNSTABLE Complications: No apparent complications Patient did tolerate procedure well. Chest X-ray ordered to verify placement.  CXR: pending.  On arrival at [facility] initiated with ACLS protocol followed. Patient was intubated for respiratory insufficiency specifically due to Apnea. Difficulty of airway was unanticipated, and once intubated no difficulty was noted. Grade 2 view, with anterior larynx. Patient was intubated w/ 1 attempt using Direct Laryngoscopy Mac 4 Blade. Positive Color Change noted and BBS to ausculation was confirmed per MD at bedside. Patient was manually ventilated throughout the duration of the CODE, all resuscitation attempts were exhausted and no ROSC was achieved.    Leigh Aurora, B.S, RRT, RCP April 27, 2019 2108

## 2019-04-26 NOTE — ED Notes (Signed)
Asked EDP to give pt and family update.

## 2019-04-26 NOTE — ED Notes (Addendum)
Pt had just returned from using the restroom when I went in to get discharge vital. She became hot and nauseous. Notified EDP who was working on pt d/c papers at that time. When I returned to the room, the pt had vomited. Notified EDP of the change in status. She came into the room quickly.

## 2019-04-26 NOTE — ED Provider Notes (Addendum)
63 year old female presents with one week of arm and leg "heaviness". She is bradycardic with HR in the mid 40s. Work up has overall been reassuring. 1st and 2nd trop are flat and she does not have symptoms consistent with ACS. She has no chest pain or SOB. EKG is sinus bradycardia.   On re-evaluation, she has no arm or leg weakness on exam. It sounds more like possible claudication as she reports increased heaviness with walking. She is also having some vague heaviness in the bilateral axilla. She is also taking a significant amount of BP medicine. She takes Amlodipine, Atenolol/Chlorthalidone, Hydralazine, and Lisinopril. Discussed with patient and her daughter. The patient has not taken any BP meds today and so she is unsure why her HR is low although on review of EMR it appears she's had a HR in the 50s. She also has decreased pulses in the lower extremities. I was not able to palpate a DP pulse but was able to doppler one. She did not have any clinical signs of DVT and she states it does not feel like when she had a blood clot in the past. She sees Dr. Donzetta Matters with vascular surgery and so she may benefit from seeing him again to obtain ABIs. She was advised to stop her Atenolol and to f/u with her PCP on Monday. They are in agreement with plan.  As the patient was getting ready to be discharged, her RN informed me that the patient became lightheaded, diaphoretic, and vomited. I went in to assess the patient at 7:30PM and and I could not feel a radial pulse. At this time Dr. Alvino Chapel was notified. Pt was placed on the monitor and CPR was started. Initially she responded and briefly regained consciousness. Defib pads were placed and she was paced. IV access was obtained and she lost consciousness again, was intubated, and CPR was resumed. After approximately 35 minutes of CPR, the patient did not regain pulses and she was pronounced deceased at 8:19PM       Recardo Evangelist, PA-C 2019-04-22 2203     Davonna Belling, MD 2019/04/22 [id number]    Davonna Belling, MD 2019/04/22 2337

## 2019-04-26 NOTE — ED Notes (Signed)
Patient is being discharged from the [facility] and sent to the Emergency Department via wheelchair by staff. Per Sunday Spillers, [place], patient is stable but in need of higher level of care due to EKG changes with fatigue. Patient is aware and verbalizes understanding of plan of care.  Vitals:   26-Apr-2019 1034  BP: 140/77  Pulse: 60  Resp: 18  Temp: (!) 97.3 F (36.3 C)  SpO2: 100%

## 2019-04-26 NOTE — Progress Notes (Signed)
The chaplain supported the family as they grieved the loss of their mother.  The chaplain passed this on to the nighttime on-call chaplain.  Brion Aliment Chaplain Resident For questions concerning this note please contact me by pager [phone]

## 2019-04-26 NOTE — Discharge Instructions (Addendum)
YOU NEED TO GO TO ER FOR FURTHER WORK UP PLEASE.  YOUR EKG LOOKS UNCHANGED, AND YOUR SUGAR WAS 125.

## 2019-04-26 NOTE — ED Notes (Signed)
Requested phlebotomy to draw trop as pt is a very difficult stick.

## 2019-04-26 NOTE — ED Notes (Signed)
Sent down 3 golds 1 blue and grey on ice

## 2019-04-26 NOTE — ED Notes (Signed)
The PA evaluated the pt and I attempted to get more vitals. We helped her from the chair to the bed. The pt went from talking to [place] to unresponsive. Compressions were started. I went to get the attending provider. RT was called at [facility].

## 2019-04-26 NOTE — ED Notes (Addendum)
Dr. Alvino Chapel, Janetta Hora PA, Anell Barr RT, pharmacist, RN's and EMT's present during code. As stated in the EDP notes the pt was in the process of being discharged and went into cardiac arrest unexpectedly. The following is a timeline of events recorded and supplemented by the EDP's notes of the event.  CPR underway, pacing Chaplain contacted [place] hung unsure of exact time, pt given approximately 400 NS  1950 pulse check, 1 mg epi 1953 pulse check, compressions resumed  1953 1 mg epi 1953 epi drip ordered 1955 epi drip hung @ 10 mg 1956 pulse check, compressions resumed 1958 pulse check, compressions resumed 1959 I mg [place] commpressions and pacing pause for [place] 2001 compressions resumed CBG 127  2002 1 mg epi 2006 pulse check 2007 1 mg epi 2008 compressions and pacing paused,  Dr Alvino Chapel used doppler to pulse check, no pulse, no underlying rhythm 2010 1 mg epi 2011 compressions and pacing paused,  Dr Alvino Chapel used doppler to pulse check, no pulse, no underlying rhythm 2012 Dr Alvino Chapel asks for pt dtr to be brought into room 2014 1 mg epi 2016 bicarb 2017 Dtr at bedside 2018 pulse check with doppler 2019 TOD called  [facility] Contacted ME contacted Pt cleaned and prepped.

## 2019-04-26 NOTE — ED Notes (Signed)
Confirmed with EDP ok to cancel IV consult.

## 2019-04-26 NOTE — ED Notes (Signed)
Phlebotomist attempting blood draws.

## 2019-04-26 NOTE — ED Notes (Signed)
Patient transported to X-ray 

## 2019-04-26 NOTE — ED Triage Notes (Signed)
Pt to ER for evaluation of weakness sent from [facility]. Recently diagnosed with UTI Thursday and was given antibiotics, and has not started it. Heart rate in the 40's. She is a/o x4. No distress at this time. No pain.

## 2019-04-26 NOTE — Discharge Instructions (Signed)
Stop your Atenolol and continue to monitor your blood pressure and pulse daily Please follow up with your primary doctor on Monday Please follow up with Dr. Donzetta Matters to have him evaluate the blood flow in your legs Return for worsening symptoms

## 2019-04-26 DEATH — deceased

## 2019-06-02 IMAGING — CT CT ABD-PELV W/O CM
1 of 2 series · 12 of 32 positions shown, 17 images · non-contrast
Comparison: Renal ultrasound with renal artery duplex on
03/26/2018. Prior CT of the abdomen without contrast on 09/02/2017
at [HOSPITAL].

CLINICAL DATA: Chronic kidney disease, stage III with significant
right renal atrophy. Renal ultrasound demonstrates lobulation of the
left kidney potentially representing exophytic mass.

EXAM:
CT ABDOMEN AND PELVIS WITHOUT CONTRAST
TECHNIQUE: Multidetector CT imaging of the abdomen and pelvis was performed
following the standard protocol without IV contrast.

[Series 2: abd/pelvis w/(date) · axial · 0.75mm/px · z∈[-413,-48]mm · 12 of 83 slices shown, 17 images]
[im 5/83  soft-tissue]
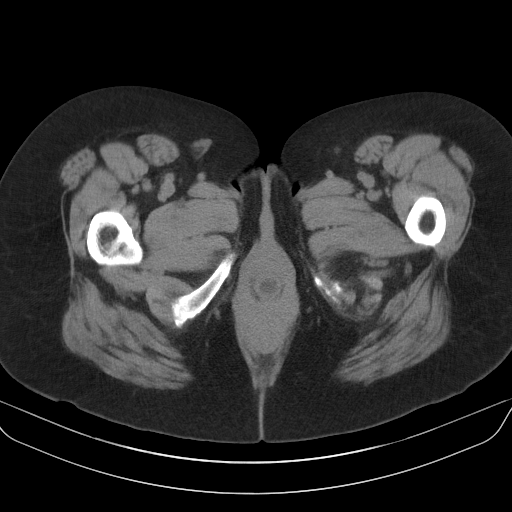
[im 5/83  bone]
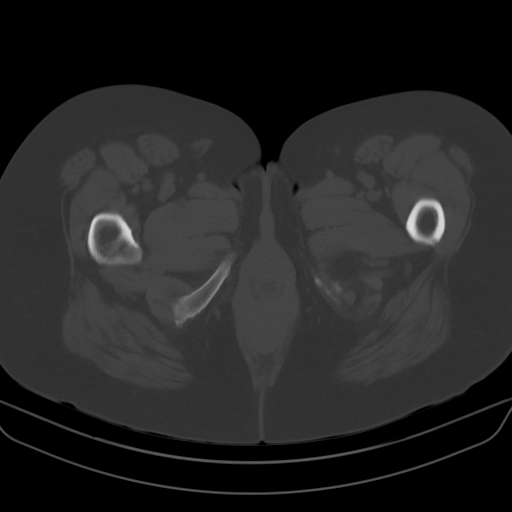
[im 14/83  soft-tissue]
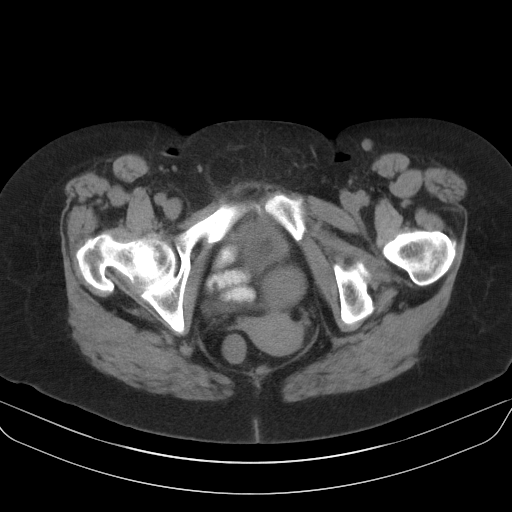
[im 19/83  soft-tissue]
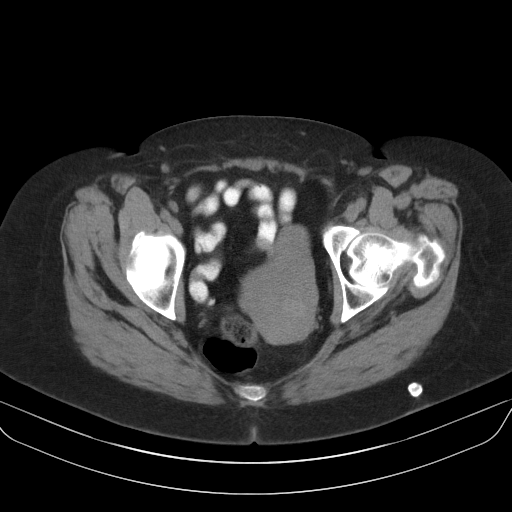
[im 28/83  soft-tissue]
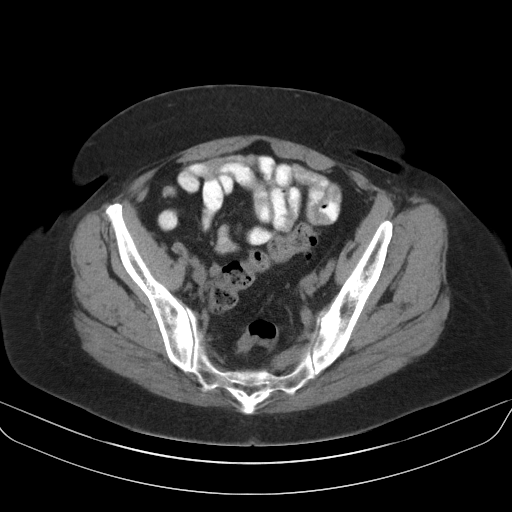
[im 32/83  soft-tissue]
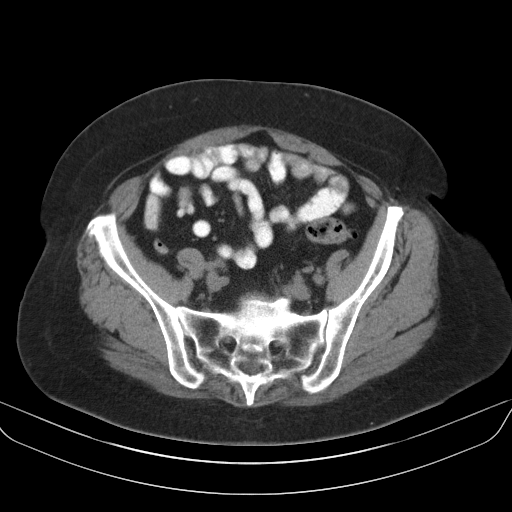
[im 42/83  soft-tissue]
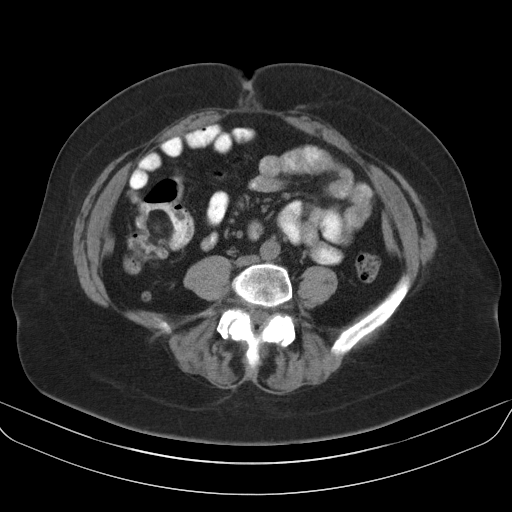
[im 51/83  soft-tissue]
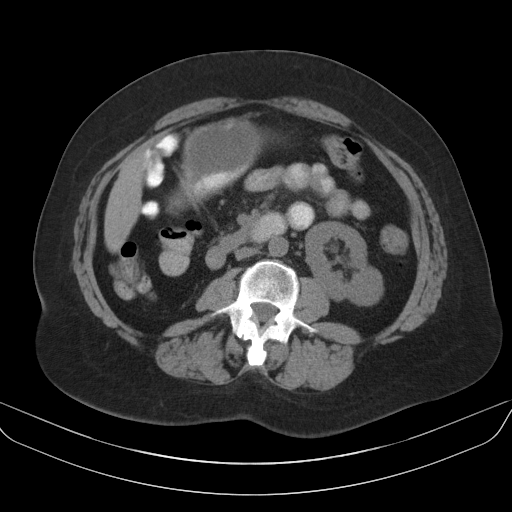
[im 55/83  soft-tissue]
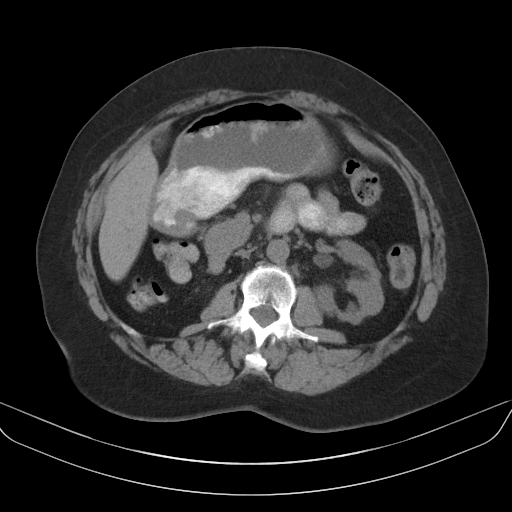
[im 64/83  soft-tissue]
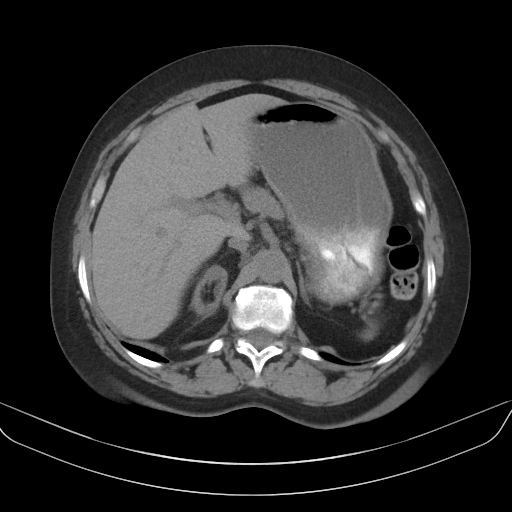
[im 64/83  lung]
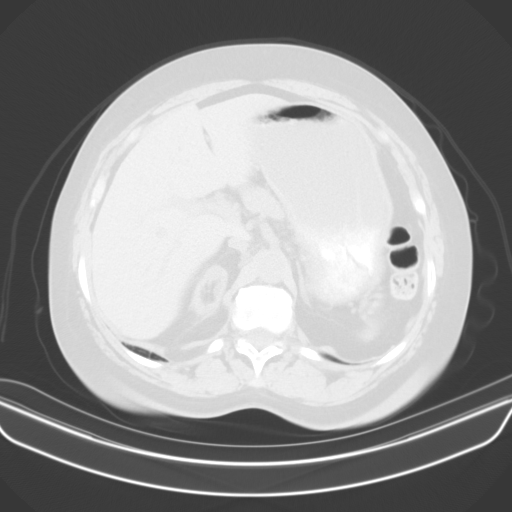
[im 64/83  bone]
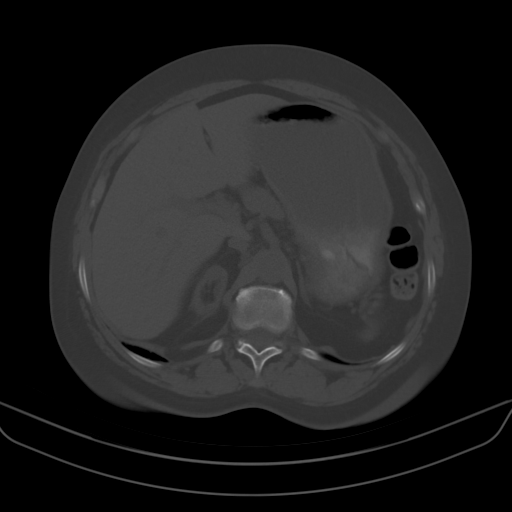
[im 69/83  soft-tissue]
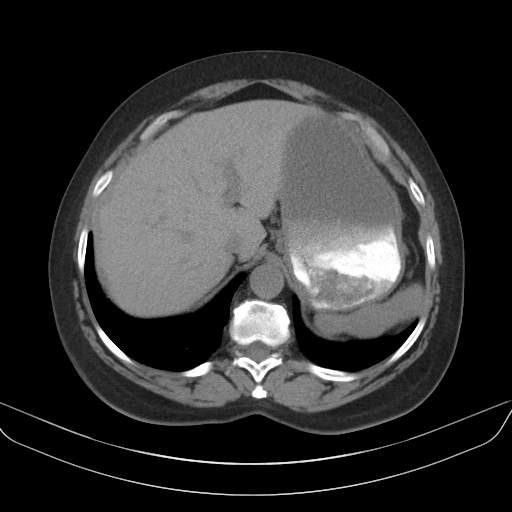
[im 69/83  lung]
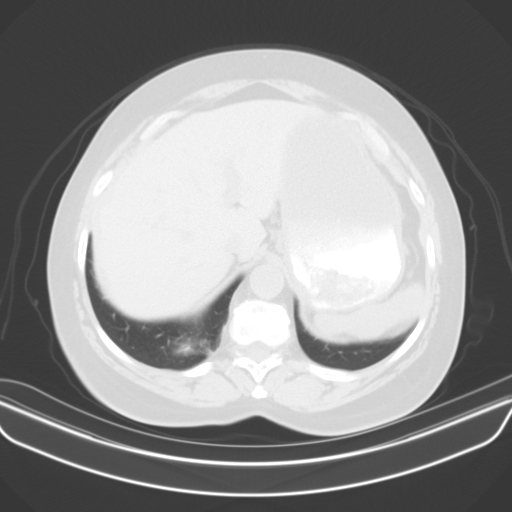
[im 73/83  lung]
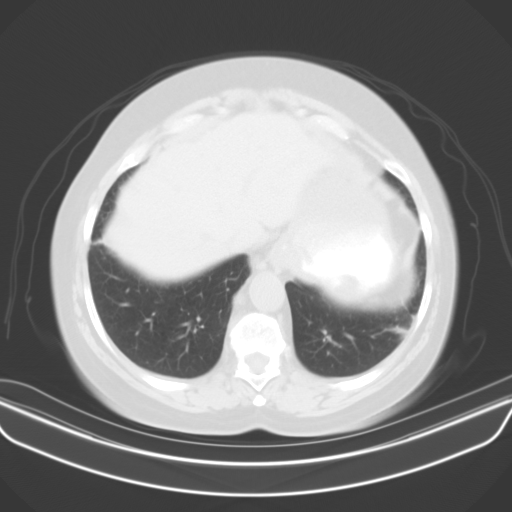
[im 78/83  soft-tissue]
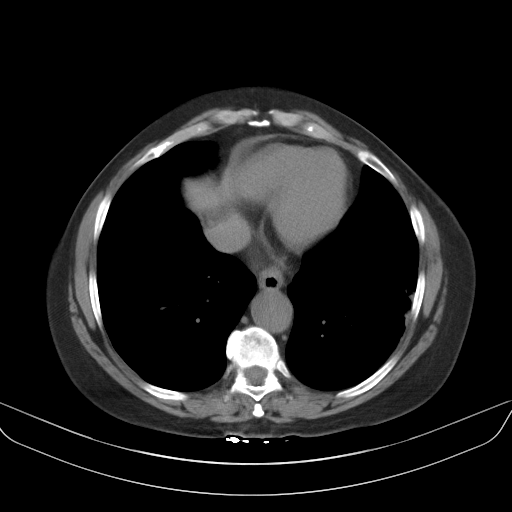
[im 78/83  lung]
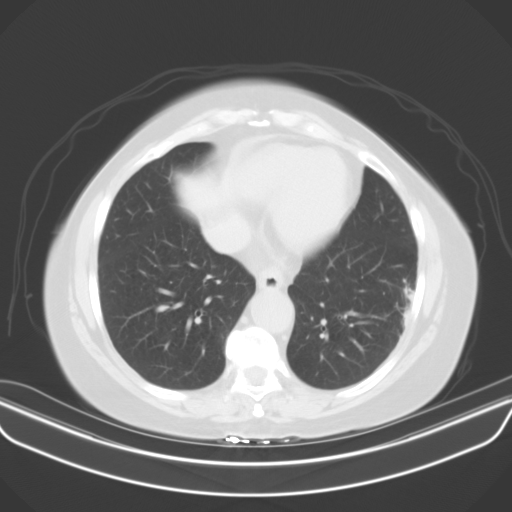

[12 of 32 positions shown; findings below may reference images not displayed]

FINDINGS: Lower chest: Stable lateral subpleural scarring at the left lung
base. The distal esophagus is mildly thickened in appearance. This
is nonspecific and may relate to chronic inflammation. The distal
esophagus had a similar appearance on the prior CT. Correlation
suggested with any history of gastroesophageal reflux or dysphagia
symptoms.

Hepatobiliary: No focal liver abnormality is seen. No gallstones,
gallbladder wall thickening, or biliary dilatation.

Pancreas: Unremarkable. No pancreatic ductal dilatation or
surrounding inflammatory changes.

Spleen: Normal in size without focal abnormality.

Adrenals/Urinary Tract: The right kidney is severely atrophic. No
hydronephrosis. The left kidney is lobulated and scarred in
appearance. This creates exophytic lobulation along the lateral
aspect of the upper pole cortex and the posterolateral aspect of the
lower pole cortex with protrusion of the posterolateral lower pole
measuring up to roughly 3.4 cm in maximal diameter. This is likely
what was imaged and measured by ultrasound as a possible renal mass.
It is impossible by unenhanced CT to determine whether this
represents a discrete mass or potentially lobulated cortex.
Unenhanced MRI would be helpful in determining if there are any
discrete left renal masses. Depending on current GFR, the patient
may be able to receive a half dose of IV gadolinium which would be
helpful in evaluating enhancement of the left kidney. No evidence of
left-sided hydronephrosis. No calculi identified. The bladder is
decompressed and there is evidence of bladder prolapse at the level
of the pelvic floor.

Stomach/Bowel: Visualized bowel shows no evidence of obstruction or
inflammation. No free air. No bowel lesions identified in the
abdomen or pelvis. The appendix is normal.

Vascular/Lymphatic: No significant vascular findings are present. No
enlarged abdominal or pelvic lymph nodes.

Reproductive: Enlarged and lobulated uterus is consistent with
leiomyomata. No adnexal masses identified.

Other: No hernias identified. No abnormal fluid collections or
ascites.

Musculoskeletal: No acute or significant osseous findings.
IMPRESSION: 1. Lobulated and scarred left kidney with severely atrophic right
kidney. There is cortical lobulation along the lateral aspect of the
upper pole and posterolateral lower pole of the left kidney. This is
more prominent along the lower pole with protrusion measuring up to
3.4 cm in maximal diameter. Underlying mass cannot be excluded by
unenhanced CT. However, it is felt that this may represent normal
pronounced lobulation and some degree of compensatory hypertrophy of
a scarred left kidney. I would recommend further evaluation with at
least unenhanced MRI of the abdomen which would be able to delineate
a separate discrete mass. Depending on current GFR, consideration of
administration of a half dose of IV gadolinium is also recommended
which would help to further evaluate enhancement pattern of the left
kidney.
2. Stable appearance of mildly thickened distal esophagus. This may
reflect chronic gastroesophageal reflux and associated inflammation.
Subtle underlying mass cannot be excluded and correlation suggested
with any history of chronic reflux or symptoms of dysphagia.
3. Enlarged and lobulated uterus consistent with underlying
leiomyomata.
